# Patient Record
Sex: Female | Born: 1980 | Hispanic: Yes | Marital: Married | Smoking: Never smoker
Health system: Southern US, Community
[De-identification: ages and names within clinical notes are randomized; demographics above are authoritative.]

## PROBLEM LIST (undated history)

## (undated) ENCOUNTER — Inpatient Hospital Stay (HOSPITAL_COMMUNITY): Payer: Self-pay

## (undated) DIAGNOSIS — R519 Headache, unspecified: Secondary | ICD-10-CM

## (undated) HISTORY — PX: NERVE AND TENDON REPAIR: SHX5693

## (undated) HISTORY — DX: Headache, unspecified: R51.9

---

## 2002-11-16 HISTORY — PX: OTHER SURGICAL HISTORY: SHX169

## 2016-03-21 ENCOUNTER — Encounter (HOSPITAL_COMMUNITY): Payer: Self-pay | Admitting: *Deleted

## 2016-03-21 ENCOUNTER — Inpatient Hospital Stay (HOSPITAL_COMMUNITY): Payer: Medicaid Other

## 2016-03-21 ENCOUNTER — Inpatient Hospital Stay (HOSPITAL_COMMUNITY)
Admission: AD | Admit: 2016-03-21 | Discharge: 2016-03-21 | Disposition: A | Discharge status: Home / Self Care | Payer: Medicaid Other | Source: Ambulatory Visit | Attending: Obstetrics and Gynecology | Admitting: Obstetrics and Gynecology

## 2016-03-21 DIAGNOSIS — O209 Hemorrhage in early pregnancy, unspecified: Secondary | ICD-10-CM | POA: Diagnosis present

## 2016-03-21 DIAGNOSIS — R55 Syncope and collapse: Secondary | ICD-10-CM | POA: Insufficient documentation

## 2016-03-21 DIAGNOSIS — O039 Complete or unspecified spontaneous abortion without complication: Secondary | ICD-10-CM | POA: Insufficient documentation

## 2016-03-21 LAB — COMPREHENSIVE METABOLIC PANEL
ALT: 29 U/L (ref 14–54)
AST: 22 U/L (ref 15–41)
Albumin: 4.1 g/dL (ref 3.5–5.0)
Alkaline Phosphatase: 74 U/L (ref 38–126)
Anion gap: 13 (ref 5–15)
BUN: 8 mg/dL (ref 6–20)
CHLORIDE: 104 mmol/L (ref 101–111)
CO2: 20 mmol/L — AB (ref 22–32)
Calcium: 9.3 mg/dL (ref 8.9–10.3)
Creatinine, Ser: 0.65 mg/dL (ref 0.44–1.00)
GFR calc Af Amer: >60 mL/min (ref >60)
Glucose, Bld: 128 mg/dL — ABNORMAL HIGH (ref 65–99)
POTASSIUM: 3.6 mmol/L (ref 3.5–5.1)
SODIUM: 137 mmol/L (ref 135–145)
Total Bilirubin: 0.4 mg/dL (ref 0.3–1.2)
Total Protein: 7.4 g/dL (ref 6.5–8.1)

## 2016-03-21 LAB — CBC
HEMATOCRIT: 38.3 % (ref 36.0–46.0)
HEMOGLOBIN: 13.3 g/dL (ref 12.0–15.0)
MCH: 30.2 pg (ref 26.0–34.0)
MCHC: 34.7 g/dL (ref 30.0–36.0)
MCV: 86.8 fL (ref 78.0–100.0)
PLATELETS: 368 10*3/uL (ref 150–400)
RBC: 4.41 MIL/uL (ref 3.87–5.11)
RDW: 12.9 % (ref 11.5–15.5)
WBC: 15.3 10*3/uL — ABNORMAL HIGH (ref 4.0–10.5)

## 2016-03-21 LAB — TYPE AND SCREEN
ABO/RH(D): A POS
Antibody Screen: NEGATIVE

## 2016-03-21 LAB — ABO/RH: ABO/RH(D): A POS

## 2016-03-21 IMAGING — US US OB TRANSVAGINAL
1 series · 15 of 28 positions shown · non-contrast
Comparison: None.

CLINICAL DATA: Vaginal bleeding

EXAM:
OBSTETRIC <14 WK US AND TRANSVAGINAL OB US
TECHNIQUE: Both transabdominal and transvaginal ultrasound examinations were
performed for complete evaluation of the gestation as well as the
maternal uterus, adnexal regions, and pelvic cul-de-sac.
Transvaginal technique was performed to assess early pregnancy.

[Series 1: us ob transvaginal · 46 acquisitions, 15 frames shown]
[im 1/46]
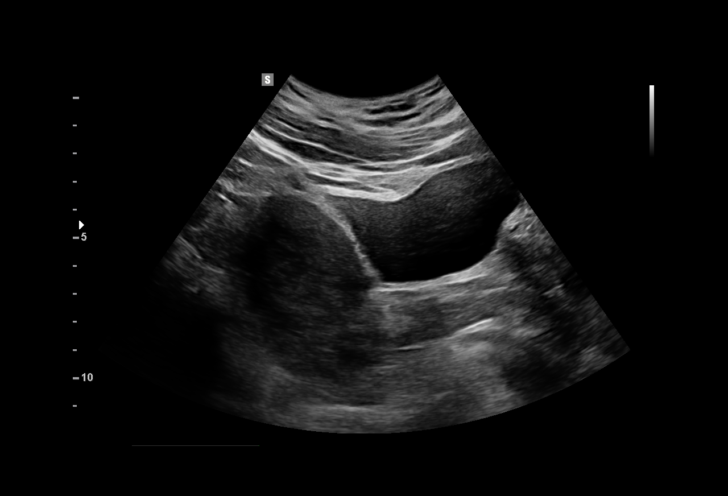
[im 4/46]
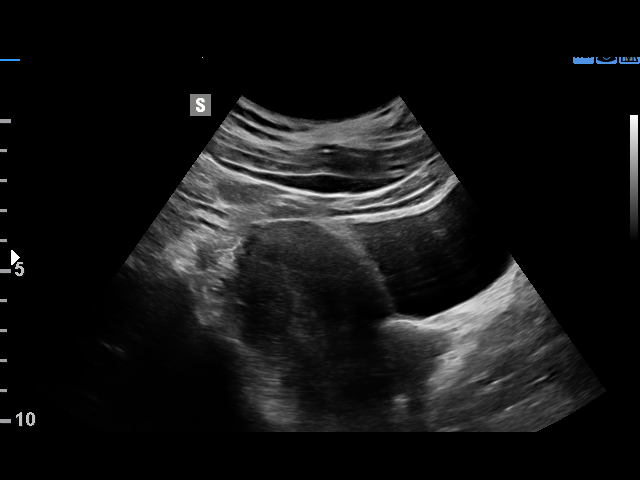
[im 7/46]
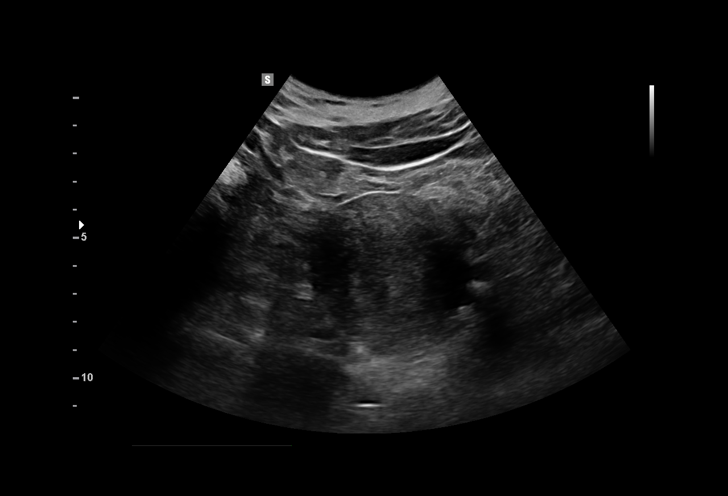
[im 11/46]
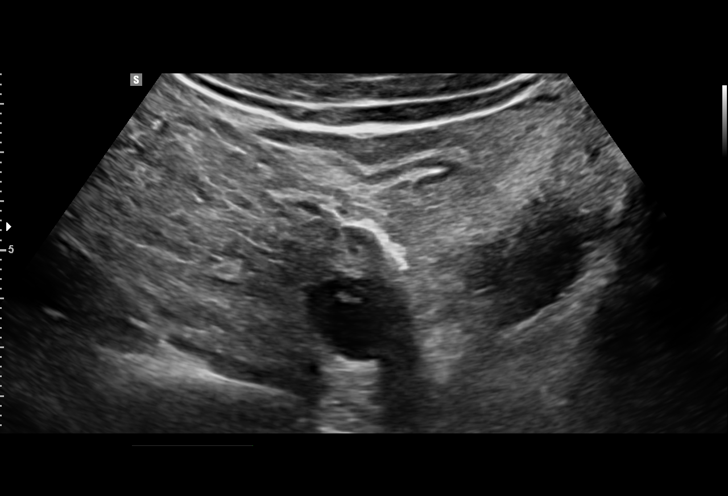
[im 14/46]
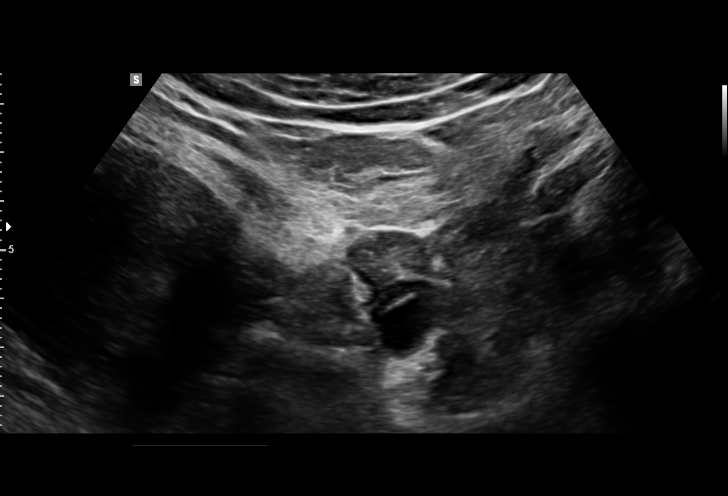
[im 17/46]
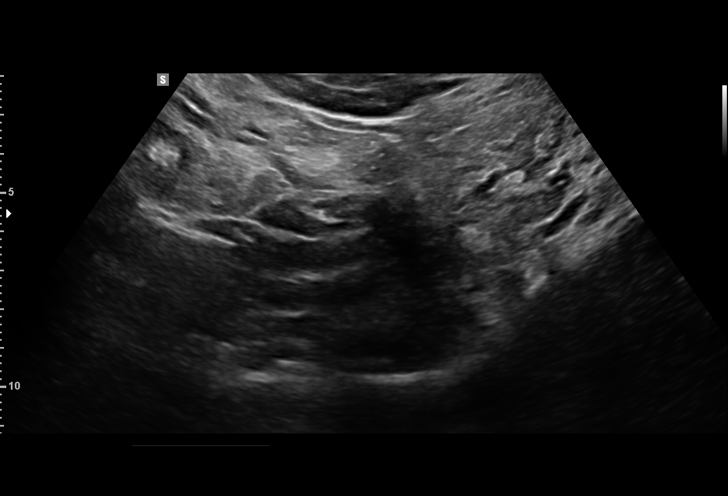
[im 21/46]
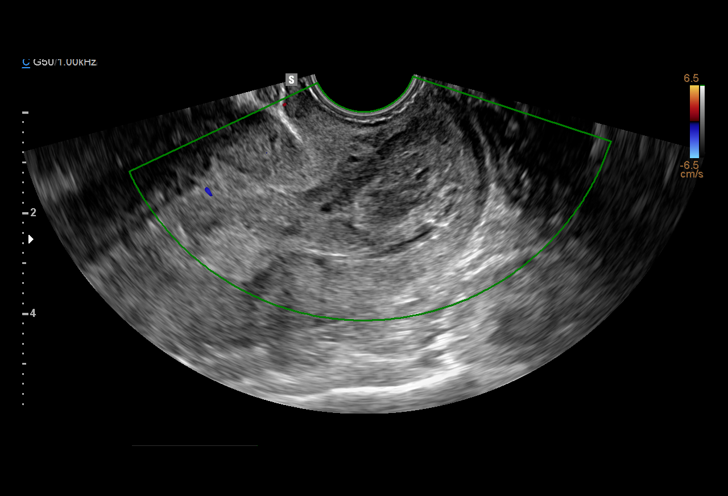
[im 24/46]
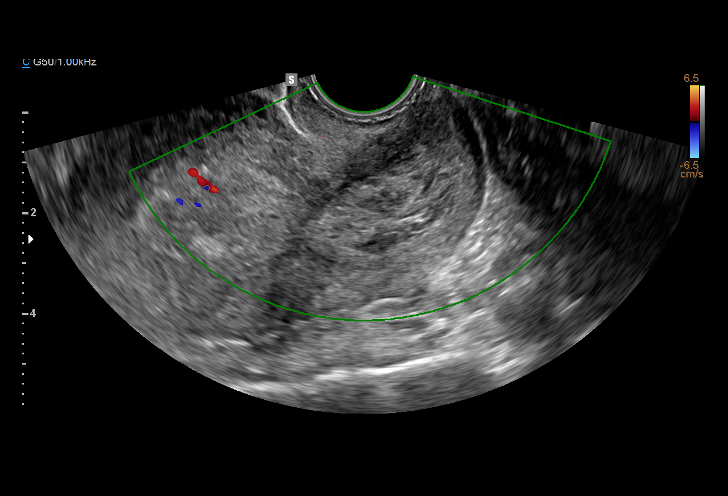
[im 26/46]
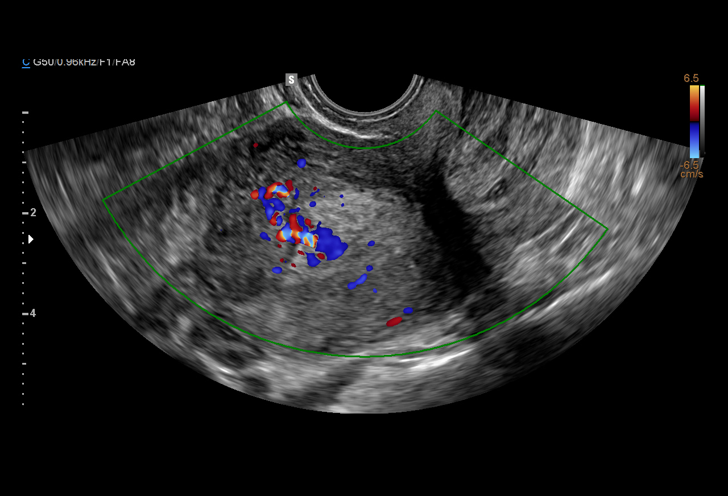
[im 29/46]
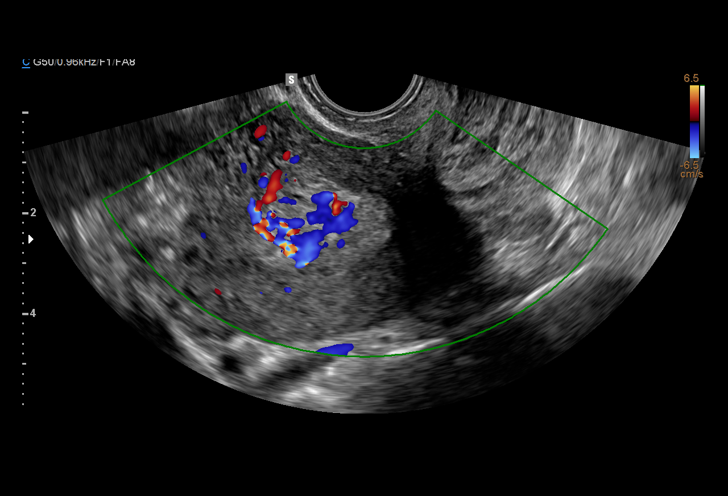
[im 32/46]
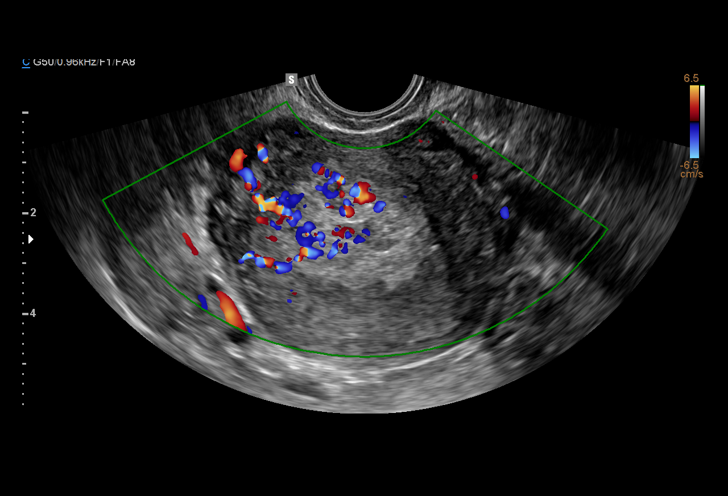
[im 36/46]
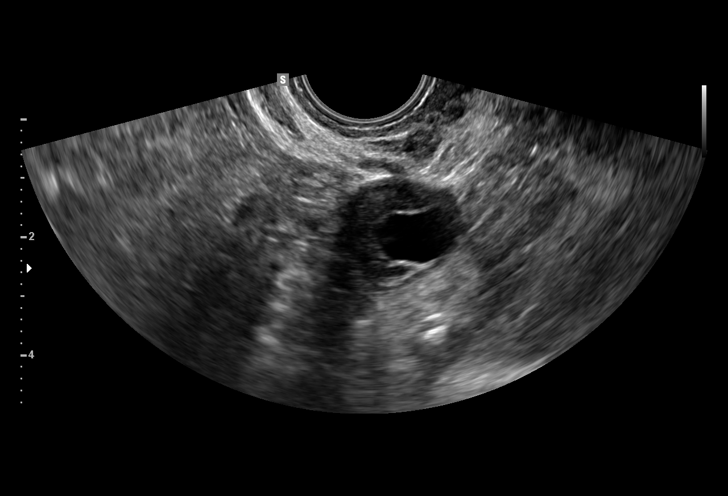
[im 39/46]
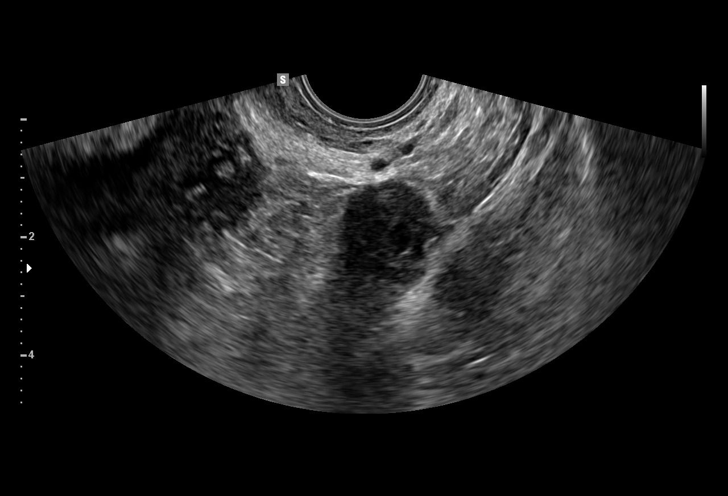
[im 42/46]
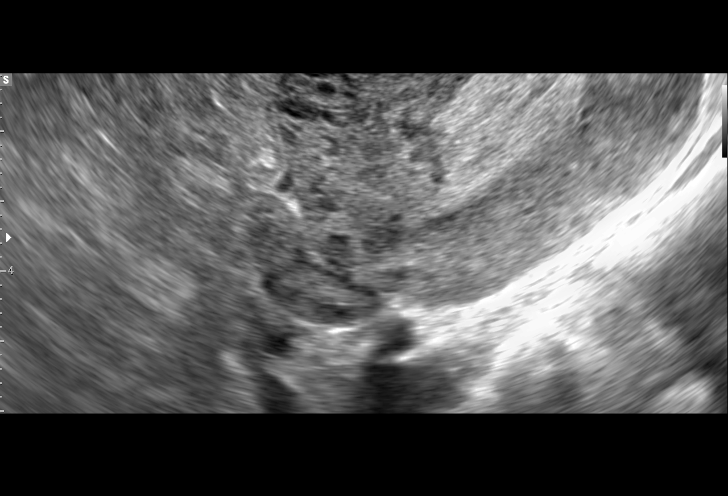
[im 46/46]
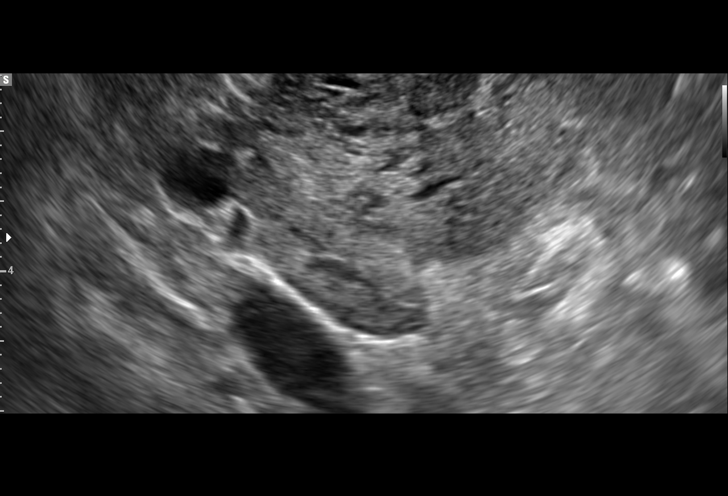

[15 of 28 positions shown; findings below may reference images not displayed]

FINDINGS: Intrauterine gestational sac: None

Yolk sac:  None

Embryo:  None

Cardiac Activity: None

Heart Rate:   bpm

MSD:   mm    w     d

CRL:    mm    w    d                  US EDC:

Subchorionic hemorrhage:  None visualized.

Maternal uterus/adnexae: Normal ovaries. Mild vascular blush in the
endometrium on the right, without discrete soft tissue or fluid. No
free pelvic fluid.
IMPRESSION: Mild vascularity of the endometrium without visible retained
products of conception.

## 2016-03-21 MED ORDER — SODIUM CHLORIDE 0.9 % IV BOLUS (SEPSIS)
1000.0000 mL | Freq: Once | INTRAVENOUS | Status: AC
  Administered 2016-03-21: 1000 mL via INTRAVENOUS

## 2016-03-21 NOTE — Discharge Instructions (Signed)
Aborto espontáneo  °(Miscarriage) °El aborto espontáneo es la pérdida de un bebé que no ha nacido (feto) antes de la semana 20 del embarazo. La mayor parte de estos abortos ocurre en los primeros 3 meses. En algunos casos ocurre antes de que la mujer sepa que está embarazada. También se denomina "aborto espontáneo" o "pérdida prematura del embarazo". El aborto espontáneo puede ser una experiencia que afecte emocionalmente a la persona. Converse con su médico si tiene dudas, cómo es el proceso de duelo, y sobre planes futuros de embarazo.  °CAUSAS  °· Algunos problemas cromosómicos pueden hacer imposible que el bebé se desarrolle normalmente. Los problemas con los genes o cromosomas del bebé son generalmente el resultado de errores que se producen, por casualidad, cuando el embrión se divide y crece. Estos problemas no se heredan de los padres. °· Infección en el cuello del útero.   °· Problemas hormonales.   °· Problemas en el cuello del útero, como tener un útero incompetente. Esto ocurre cuando los tejidos no son lo suficientemente fuertes como para contener el embarazo.   °· Problemas del útero, como un útero con forma anormal, los fibromas o anormalidades congénitas.   °· Ciertas enfermedades crónicas.   °· No fume, no beba alcohol, ni consuma drogas.   °· Traumatismos   °A veces, la causa es desconocida.  °SÍNTOMAS  °· Sangrado o manchado vaginal, con o sin cólicos o dolor. °· Dolor o cólicos en el abdomen o en la cintura. °· Eliminación de líquido, tejidos o coágulos grandes por la vagina. °DIAGNÓSTICO  °El médico le hará un examen físico. También le indicará una ecografía para confirmar el aborto. Es posible que se realicen análisis de sangre.  °TRATAMIENTO  °· En algunos casos el tratamiento no es necesario, si se eliminan naturalmente todos los tejidos embrionarios que se encontraban en el útero. Si el feto o la placenta quedan dentro del útero (aborto incompleto), pueden infectarse, los tejidos que quedan  pueden infectarse y deben retirarse. Generalmente se realiza un procedimiento de dilatación y curetaje (D y C). Durante el procedimiento de dilatación y curetaje, el cuello del útero se abre (dilata) y se retira cualquier resto de tejido fetal o placentario del útero. °· Si hay una infección, le recetarán antibióticos. Podrán recetarle otros medicamentos para reducir el tamaño del útero (contraerlo) si hay una mucho sangrado. °· Si su sangre es Rh negativa y su bebé es Rh positivo, usted necesitará la inyección de inmunoglobulina Rh. Esta inyección protegerá a los futuros bebés de tener problemas de compatibilidad Rh en futuros embarazos. °INSTRUCCIONES PARA EL CUIDADO EN EL HOGAR  °· El médico le indicará reposo en cama o le permitirá realizar actividades livianas. Vuelva a la actividad lentamente o según las indicaciones de su médico. °· Pídale a alguien que la ayude con las responsabilidades familiares y del hogar durante este tiempo.   °· Lleve un registro de la cantidad y la saturación de las toallas higiénicas que utiliza cada día. Anote esta información   °· No use tampones. No No se haga duchas vaginales ni tenga relaciones sexuales hasta que el médico la autorice.   °· Sólo tome medicamentos de venta libre o recetados para calmar el dolor o el malestar, según las indicaciones de su médico.   °· No tome aspirina. La aspirina puede ocasionar hemorragias.   °· Concurra puntualmente a las citas de control con el médico.   °· Si usted o su pareja tienen dificultades con el duelo, hable con su médico para buscar la ayuda psicológica que los ayude a enfrentar la pérdida   del embarazo. Permtase el tiempo suficiente de duelo antes de quedar embarazada nuevamente.  SOLICITE ATENCIN MDICA DE INMEDIATO SI:   Siente calambres intensos o dolor en la espalda o en el abdomen.  Tiene fiebre.  Elimina grandes cogulos de [place] (del tamao de una nuez o ms) o tejidos por la vagina. Guarde lo que ha eliminado para  que su mdico lo examine.   La hemorragia aumenta.   Brett Fairybserva una secrecin vaginal espesa y con mal olor.  Se siente mareada, dbil, o se desmaya.   Siente escalofros.  ASEGRESE DE QUE:   Comprende estas instrucciones.  Controlar su enfermedad.  Solicitar ayuda de inmediato si no mejora o si empeora.   Esta informacin no tiene Theme park managercomo fin reemplazar el consejo del mdico. Asegrese de hacerle al mdico cualquier pregunta que tenga.   Document Released: 08/12/2005 Document Revised: 02/27/2013 Elsevier Interactive Patient Education Yahoo[employer] Inc2016 Elsevier Inc.  (Vasovagal Syncope, Adult) El sncope, comnmente conocido como [place] Parkdesmayo, es una prdida transitoria de la conciencia. Se produce cuando se reduce el flujo sanguneo al cerebro. El sncope vasovagal (tambin llamado sncope neurocardiognico) es un desmayo en el que el flujo de sangre al cerebro se reduce a causa de una cada repentina en la frecuencia cardaca y la presin arterial. El sncope vasovagal se produce cuando el cerebro y el sistema cardiovascular (vasos sanguneos) no se comunican ni responden el uno al otro de [place]era adecuada. Esta es la causa ms comn de Jeffdesmayos. A menudo se produce como respuesta al miedo o algn otro tipo de estrs emocional o fsico. El cuerpo tiene una reaccin en la que el corazn comienza a latir muy lentamente o los vasos sanguneos se expanden, para reducir la presin arterial. Este tipo de desmayo se considera generalmente inofensivo. Sin embargo, pueden ocurrir lesiones si la persona tiene una cada repentina durante el Farrelldesmayo.  CAUSAS  El sncope vasovagal se produce cuando la presin arterial y la frecuencia cardaca de una persona disminuyen de repente, por lo general en respuesta a un factor desencadenante. Muchas cosas y situaciones pueden desencadenar un episodio. Entre ellas se incluyen:   Dolor.   Temor.   Ver sangre o procedimientos mdicos, como la sangre que se extrae de una  vena.   Las Brink's Companyactividades comunes, tales como toser, Financial controllerestirarse, o ir al bao.   Estrs emocional.   Permanecer mucho tiempo de pie, especialmente en un ambiente clido.   La falta de sueo o de descanso.   Prolongada falta de alimentos.   Prolongada falta de lquidos.   Enfermedad reciente.  El uso de ciertas drogas que afectan la presin arterial, como la cocana, el alcohol, la marihuana, los inhalantes y los opiceos.  SNTOMAS  Antes del episodio de desmayo, es posible que:   Se sienta mareado o dbil.   Se vuelve plido.  Sienta que se va a desmayar.   Siente como si la habitacin diera vueltas.   Tiene una visin de tnel, slo ve lo que hay directamente frente a usted.   Siente malestar estomacal (nuseas).   Ve manchas o pierde poco a poco la visin.   Escucha un zumbido en los odos.   Tiene dolor de Turkmenistancabeza.  Se siente afiebrado y sudoroso.   Tiene sensacin de hinchazn u hormigueo. Durante el desmayo, por lo general, estar inconsciente durante no ms de un par de minutos antes de despertar y volver a la normalidad. Si usted se levanta demasiado rpido antes de que su cuerpo pueda recuperarse, se va  a Systems analyst. Durante el desmayo pueden producirse movimientos espasmdicos o bruscos.  DIAGNSTICO  El mdico le preguntar [employer] sntomas y le har una historia clnica y un examen fsico. Se pueden hacer varios estudios para Sales promotion account executive otras causas de los [place]. Estos pueden incluir anlisis de [place] y pruebas para revisar el corazn, como un Midwife, y posiblemente, un estudio de Actor. Cuando otras causas han sido descartadas, se puede Education officer, environmental un examen para comprobar la respuesta del organismo a los cambios de posicin (prueba de la mesa basculante).  TRATAMIENTO  La mayora de los casos de sncope vasovagal no requieren TEFL teacher. Su mdico le puede Financial planner de Automotive engineer los  desencadenantes de [place] y puede proporcionarle estrategias caseras para prevenir desmayos. Si tiene que estar expuesto a un posible desencadenante, puede beber lquidos adicionales para ayudar a reducir sus probabilidades de sufrir un episodio de sncope vasovagal. Si hay signos de advertencia de un episodio que se acerca, usted puede responder posicionndose favorablemente (acostarse).  Si los [employer], se le pueden dar medicamentos para evitarlos. Algunos medicamentos pueden ayudar a hacerlo ms resistente a episodios repetidos de sncope vasovagal. Se pueden recomendar ejercicios especiales o medias de compresin. En raros casos, se considera la colocacin quirrgica de un marcapasos.  INSTRUCCIONES PARA EL CUIDADO DOMICILIARIO   Aprenda a identificar las seales del sncope vasovagal.   Sintese o acustese a la primera seal de un desmayo. Si se sienta, ponga su cabeza [employer]. Si usted se [place], coloque las piernas [place] para aumentar el flujo de sangre al cerebro.   Evite los baos calientes y los saunas.  Evite estar mucho tiempo de pie.  Debe ingerir gran cantidad de lquido para mantener la orina de tono claro o color amarillo plido. Evite la cafena.  Aumente la sal en su dieta siguiendo las indicaciones de su mdico.   Si tiene que estar parado durante mucho tiempo, realice movimientos tales como:   Psychologist, counselling las piernas.   Flexionar y Furniture conservator/restorer los msculos de las piernas.   Ponerse en cuclillas.   Mover las piernas.   Doblarse.   Tome slo medicamentos de venta libre o recetados, segn las indicaciones del mdico. No deje de tomar ningn medicamento sin consultar con su mdico primero. SOLICITE ATENCIN MDICA SI:   Sus desmayos continan o suceden con mayor frecuencia a pesar del tratamiento.   Pierde el conocimiento durante ms de un par de minutos.  Se ha desmayado durante o despus de hacer ejercicio o si se sorprende.    Usted tiene nuevos sntomas que se producen con los [employer]ogg, tales como:   Falta de [place].  Dolor en el pecho.   Latidos cardacos irregulares.   Usted tiene episodios de espasmos o movimientos bruscos que duran ms de unos pocos segundos.  Usted tiene episodios de espasmos o movimientos bruscos sin desmayos. BUSQUE ATENCIN MDICA DE INMEDIATO SI:   Usted tiene lesiones o hemorragia despus de un desmayo.   Usted tiene episodios de espasmos o movimientos bruscos que duran ms de 5 minutos.   Usted tiene ms de un episodio de movimientos espasmdicos antes de Copy la consciencia despus de un desmayo.   Esta informacin no tiene Theme park manager el consejo del mdico. Asegrese de hacerle al mdico cualquier pregunta que tenga.   Document Released: 02/27/2013 Document Revised: 03/19/2015 Elsevier Interactive Patient Education Yahoo! Inc.

## 2016-03-21 NOTE — MAU Note (Signed)
Was seen by Dr. Jackelyn KnifeMeisinger on Thursday, had an US no baby seen, so had to use Cytotec vaginally last night, took Percocet this morning, patient has had a lot of bleeding started around 3:00 with passing clots and after 7 only bleeding without clots, passed out twice one onto the bed and one fell in bathroom floor, having severe cramping, have not eaten since last night, changing a pad every 30 minutes.Jacqueline Gray. LMP 12/31/15

## 2016-03-21 NOTE — MAU Provider Note (Signed)
Chief Complaint: Vaginal Bleeding and Near Syncope   First Provider Initiated Contact with Patient 03/21/16 1919     SUBJECTIVE HPI: Jacqueline Gray is a 35 y.o. G1P0000 diagnosed with missed AB at [facility] on 03/19/2016 who presents to Maternity Admissions reporting heavy vaginal bleeding, passing clots and 2 syncopal episodes. Ultrasound 03/04/2016 in the office showed crown-rump length of 5 weeks 5 days with no fetal heart rate. Follow-up ultrasound 03/19/2016 showed gestational sac with no fetal pole.   Took Cytotec last night at 11 PM. Started bleeding heavily at 3 AM. Syncopal episodes occurred when patient got up out of bed because she felt like she needed to go to the bathroom. She passed clots and then passed out. With The first episode she thinks she was unconscious for a few minutes, but is not sure how long. The second episode occurred when her husband was coming home from work. He couldn't find her and realized that she was in the bathroom with the door locked. He states he had to kick the door in to get her. No history of syncopal episodes. Denies any injury during syncopal episodes.  Syncope  Duration: Less than 12 hours Associated symptoms: Positive for dizziness. Negative for loss of bladder control, tongue biting, shaking.  Abdominal pain Location: Suprapubic Quality: Cramping Severity: Moderate Duration: Less than 24 hours Context: With bleeding and passing clots Timing: Intermittent Modifying factors: Improved significantly with Percocet Associated signs and symptoms: Positive for vaginal bleeding, passing clots and possible tissue, syncope.  History reviewed. No pertinent past medical history. OB History  Gravida Para Term Preterm AB SAB TAB Ectopic Multiple Living  1    0 0        # Outcome Date GA Lbr Len/2nd Weight Sex Delivery Anes PTL Lv  1 Current              Past Surgical History  Procedure Laterality Date  . Nerve and tendon repair Left     arm    Social History   Social History  . Marital Status: Married    Spouse Name: N/A  . Number of Children: N/A  . Years of Education: N/A   Occupational History  . Not on file.   Social History Main Topics  . Smoking status: Never Smoker   . Smokeless tobacco: Not on file  . Alcohol Use: No  . Drug Use: No  . Sexual Activity: Yes   Other Topics Concern  . Not on file   Social History Narrative  . No narrative on file   No current facility-administered medications on file prior to encounter.   No current outpatient prescriptions on file prior to encounter.   No Known Allergies  I have reviewed the past Medical Hx, Surgical Hx, Social Hx, Allergies and Medications.   Review of Systems  Constitutional: Negative for fever and chills.  Eyes: Negative for visual disturbance.  Respiratory: Negative for shortness of breath.   Cardiovascular: Negative for chest pain.  Gastrointestinal: Positive for abdominal pain. Negative for nausea, vomiting, diarrhea and constipation.  Genitourinary: Positive for vaginal bleeding and pelvic pain. Negative for dysuria.  Neurological: Positive for dizziness and syncope. Negative for seizures, facial asymmetry, speech difficulty and headaches.    OBJECTIVE Patient Vitals for the past 24 hrs:  BP Temp Temp src Pulse Resp SpO2 Height Weight  03/21/16 1950 115/63 mmHg - - 96 - - - -  03/21/16 1526 116/71 mmHg 97.8 F (36.6 C) Oral - 18 100 %   (1.651 m) 160 lb 6.4 oz (72.757 kg)   Orthostatic vital signs Lying: BP 124/72  pulse 66 Sitting: BP 118/75 pulse 85  Standing: BP 107/77  pulse 1:15    Constitutional: Well-developed, well-nourished female in no acute distress. Mild pallor. Head: No swelling, tenderness or bruising Cardiovascular: normal rate Respiratory: normal rate and effort.  GI: Abd soft, non-tender. Neurologic: Alert and oriented x 4.  GU: Small amount of bright red blood on pad. Speculum exam deferred.  LAB  RESULTS Results for orders placed or performed during the hospital encounter of 03/21/16 (from the past 24 hour(s))  Comprehensive metabolic panel     Status: Abnormal   Collection Time: 03/21/16  3:44 PM  Result Value Ref Range   Sodium 137 135 - 145 mmol/L   Potassium 3.6 3.5 - 5.1 mmol/L   Chloride 104 101 - 111 mmol/L   CO2 20 (L) 22 - 32 mmol/L   Glucose, Bld 128 (H) 65 - 99 mg/dL   BUN 8 6 - 20 mg/dL   Creatinine, Ser 1.61 0.44 - 1.00 mg/dL   Calcium 9.3 8.9 - 09.6 mg/dL   Total Protein 7.4 6.5 - 8.1 g/dL   Albumin 4.1 3.5 - 5.0 g/dL   AST 22 15 - 41 U/L   ALT 29 14 - 54 U/L   Alkaline Phosphatase 74 38 - 126 U/L   Total Bilirubin 0.4 0.3 - 1.2 mg/dL   GFR calc non Af Amer >60 >60 mL/min   GFR calc Af Amer >60 >60 mL/min   Anion gap 13 5 - 15  ABO/Rh     Status: None   Collection Time: 03/21/16  3:49 PM  Result Value Ref Range   ABO/RH(D) A POS   CBC     Status: Abnormal   Collection Time: 03/21/16  3:50 PM  Result Value Ref Range   WBC 15.3 (H) 4.0 - 10.5 K/uL   RBC 4.41 3.87 - 5.11 MIL/uL   Hemoglobin 13.3 12.0 - 15.0 g/dL   HCT 04.5 40.9 - 81.1 %   MCV 86.8 78.0 - 100.0 fL   MCH 30.2 26.0 - 34.0 pg   MCHC 34.7 30.0 - 36.0 g/dL   RDW 91.4 78.2 - 95.6 %   Platelets 368 150 - 400 K/uL  Type and screen     Status: None   Collection Time: 03/21/16  3:50 PM  Result Value Ref Range   ABO/RH(D) A POS    Antibody Screen NEG    Sample Expiration 03/24/2016     IMAGING US Ob Comp Less 14 Wks  03/21/2016  CLINICAL DATA:  Vaginal bleeding EXAM: OBSTETRIC <14 WK [place] AND TRANSVAGINAL OB US TECHNIQUE: Both transabdominal and transvaginal ultrasound examinations were performed for complete evaluation of the gestation as well as the maternal uterus, adnexal regions, and pelvic cul-de-sac. Transvaginal technique was performed to assess early pregnancy. COMPARISON:  None. FINDINGS: Intrauterine gestational sac: None Yolk sac:  None Embryo:  None Cardiac Activity: None Heart Rate:    bpm MSD:   mm    w     d CRL:    mm    w    d                  [place] EDC: Subchorionic hemorrhage:  None visualized. Maternal uterus/adnexae: Normal ovaries. Mild vascular blush in the endometrium on the right, without discrete soft tissue or fluid. No free pelvic fluid. IMPRESSION: Mild vascularity of the endometrium  without visible retained products of conception. Electronically Signed   By: Ellery Plunkaniel R Mitchell M.D.   On: 03/21/2016 18:46   [place] Ob Transvaginal  03/21/2016  CLINICAL DATA:  Vaginal bleeding EXAM: OBSTETRIC <14 WK US AND TRANSVAGINAL OB US TECHNIQUE: Both transabdominal and transvaginal ultrasound examinations were performed for complete evaluation of the gestation as well as the maternal uterus, adnexal regions, and pelvic cul-de-sac. Transvaginal technique was performed to assess early pregnancy. COMPARISON:  None. FINDINGS: Intrauterine gestational sac: None Yolk sac:  None Embryo:  None Cardiac Activity: None Heart Rate:   bpm MSD:   mm    w     d CRL:    mm    w    d                  US EDC: Subchorionic hemorrhage:  None visualized. Maternal uterus/adnexae: Normal ovaries. Mild vascular blush in the endometrium on the right, without discrete soft tissue or fluid. No free pelvic fluid. IMPRESSION: Mild vascularity of the endometrium without visible retained products of conception. Electronically Signed   By: Ellery Plunkaniel R Mitchell M.D.   On: 03/21/2016 18:46    MAU COURSE CBC, CMP, type and screen, saline bolus, with a static vital signs, bedside ultrasound.  Patient feeling much better. Able to ambulate without dizziness. Minimal bleeding while in maternity admissions.  Discussed history, physical exam, lab and ultrasound reports with Dr. Mindi SlickerBanga. Agrees with plan of care. Patient appropriate for discharge with follow-up within the week.  MDM - 35 year old female with heavy bleeding and vasovagal syncopal episodes during passage of large clots and tissue during miscarriage. Miscarriage now  appears to be complete. Hemodynamically stable at time of discharge. No anemia.   ASSESSMENT 1. Complete miscarriage   2. Vasovagal syncope     PLAN Discharge home in stable conditionPer consult with Dr. Mindi SlickerBanga Bleeding and SAB precautions  Continue Motrin and Percocet as needed for pain.  Follow-up Information    Follow up with Bovard-Stuckert, Jody, MD. Schedule an appointment as soon as possible for a visit in 1 week.   Specialty:  Obstetrics and Gynecology   Contact information:   [address] [place] [id number] [phone]       [facility].   Why:  As needed if symptoms worsen   Contact information:   [address] [id number] mc [place] [id number] [phone]       Medication List    STOP taking these medications        misoprostol 200 MCG tablet  Commonly known as:  CYTOTEC      TAKE these medications        oxyCODONE-acetaminophen 5-325 MG tablet  Commonly known as:  PERCOCET/ROXICET  Take 1-2 tablets by mouth every 4 (four) hours as needed for severe pain.         DaleVirginia Reta Norgren, [place] 03/21/2016  7:19 PM

## 2016-03-22 NOTE — Progress Notes (Signed)
Assisted with interpretation of patient information.  Spanish Interpreter

## 2016-03-22 NOTE — Progress Notes (Signed)
Assisted Midwife with interpretation of discharge instructions.  Spanish Interpreter

## 2016-03-22 NOTE — Progress Notes (Signed)
Assisted RN with interpretation of patient assessment.   °Spanish Interpreter °

## 2016-10-15 LAB — OB RESULTS CONSOLE GC/CHLAMYDIA
Chlamydia: NEGATIVE
Gonorrhea: NEGATIVE

## 2016-10-15 LAB — OB RESULTS CONSOLE RPR: RPR: NONREACTIVE

## 2016-10-15 LAB — OB RESULTS CONSOLE ABO/RH: RH TYPE: POSITIVE

## 2016-10-15 LAB — OB RESULTS CONSOLE HEPATITIS B SURFACE ANTIGEN: HEP B S AG: NEGATIVE

## 2016-10-15 LAB — OB RESULTS CONSOLE RUBELLA ANTIBODY, IGM: Rubella: IMMUNE

## 2016-10-15 LAB — OB RESULTS CONSOLE HIV ANTIBODY (ROUTINE TESTING): HIV: NONREACTIVE

## 2016-10-15 LAB — OB RESULTS CONSOLE ANTIBODY SCREEN: Antibody Screen: NEGATIVE

## 2016-11-16 NOTE — L&D Delivery Note (Signed)
Delivery Note Pt pushed very well for delivery.  At 1:45 PM a viable and healthy female was delivered via Vaginal, Spontaneous Delivery (Presentation: OA; ROT  ).  APGAR: 9, 9; weight  P.   Placenta status: delivered, intact .  Cord: 3V with the following complications: none.    Anesthesia:  epidural Episiotomy: None Lacerations: Labial L Suture Repair: hemostatic - no repair Est. Blood Loss (mL): 200  Mom to postpartum.  Baby to Couplet care / Skin to Skin.  Jaylon Grode Bovard-Stuckert 05/13/2017, 1:59 PM  Br/A+/Tdap in PNC/Contra?Rachelle Hora/[place]

## 2017-01-24 ENCOUNTER — Encounter (HOSPITAL_COMMUNITY): Payer: Self-pay

## 2017-03-08 DIAGNOSIS — Z23 Encounter for immunization: Secondary | ICD-10-CM | POA: Diagnosis not present

## 2017-03-08 DIAGNOSIS — Z3A29 29 weeks gestation of pregnancy: Secondary | ICD-10-CM | POA: Diagnosis not present

## 2017-04-19 DIAGNOSIS — Z3685 Encounter for antenatal screening for Streptococcus B: Secondary | ICD-10-CM | POA: Diagnosis not present

## 2017-04-20 LAB — OB RESULTS CONSOLE GBS: STREP GROUP B AG: POSITIVE

## 2017-05-12 ENCOUNTER — Encounter (HOSPITAL_COMMUNITY): Payer: Self-pay | Admitting: *Deleted

## 2017-05-12 ENCOUNTER — Telehealth (HOSPITAL_COMMUNITY): Payer: Self-pay | Admitting: *Deleted

## 2017-05-12 NOTE — Telephone Encounter (Signed)
Preadmission screen [phone] interpreter number

## 2017-05-13 ENCOUNTER — Inpatient Hospital Stay (HOSPITAL_COMMUNITY): Payer: Medicaid Other | Admitting: Anesthesiology

## 2017-05-13 ENCOUNTER — Encounter (HOSPITAL_COMMUNITY): Payer: Self-pay

## 2017-05-13 ENCOUNTER — Inpatient Hospital Stay (HOSPITAL_COMMUNITY)
Admission: AD | Admit: 2017-05-13 | Discharge: 2017-05-15 | DRG: 775 | Disposition: A | Discharge status: Home / Self Care | Payer: Medicaid Other | Source: Ambulatory Visit | Attending: Obstetrics and Gynecology | Admitting: Obstetrics and Gynecology

## 2017-05-13 DIAGNOSIS — Z3A39 39 weeks gestation of pregnancy: Secondary | ICD-10-CM | POA: Diagnosis not present

## 2017-05-13 DIAGNOSIS — O99824 Streptococcus B carrier state complicating childbirth: Secondary | ICD-10-CM | POA: Diagnosis present

## 2017-05-13 DIAGNOSIS — Z3493 Encounter for supervision of normal pregnancy, unspecified, third trimester: Secondary | ICD-10-CM | POA: Diagnosis present

## 2017-05-13 LAB — TYPE AND SCREEN
ABO/RH(D): A POS
Antibody Screen: NEGATIVE

## 2017-05-13 LAB — CBC
HCT: 41.3 % (ref 36.0–46.0)
Hemoglobin: 14.3 g/dL (ref 12.0–15.0)
MCH: 30.4 pg (ref 26.0–34.0)
MCHC: 34.6 g/dL (ref 30.0–36.0)
MCV: 87.7 fL (ref 78.0–100.0)
PLATELETS: 276 10*3/uL (ref 150–400)
RBC: 4.71 MIL/uL (ref 3.87–5.11)
RDW: 13.2 % (ref 11.5–15.5)
WBC: 12.3 10*3/uL — ABNORMAL HIGH (ref 4.0–10.5)

## 2017-05-13 LAB — RPR: RPR Ser Ql: NONREACTIVE

## 2017-05-13 LAB — POCT FERN TEST: POCT Fern Test: POSITIVE

## 2017-05-13 MED ORDER — PHENYLEPHRINE 40 MCG/ML (10ML) SYRINGE FOR IV PUSH (FOR BLOOD PRESSURE SUPPORT)
80.0000 ug | PREFILLED_SYRINGE | INTRAVENOUS | Status: DC | PRN
  Filled 2017-05-13: qty 5

## 2017-05-13 MED ORDER — PENICILLIN G POT IN DEXTROSE 60000 UNIT/ML IV SOLN
3.0000 10*6.[IU] | INTRAVENOUS | Status: DC
  Administered 2017-05-13 (×2): 3 10*6.[IU] via INTRAVENOUS
  Filled 2017-05-13 (×8): qty 50

## 2017-05-13 MED ORDER — FENTANYL 2.5 MCG/ML BUPIVACAINE 1/10 % EPIDURAL INFUSION (WH - ANES)
14.0000 mL/h | INTRAMUSCULAR | Status: DC | PRN
  Administered 2017-05-13 (×2): 14 mL/h via EPIDURAL
  Filled 2017-05-13 (×2): qty 100

## 2017-05-13 MED ORDER — OXYCODONE HCL 5 MG PO TABS: 5.0000 mg | ORAL_TABLET | ORAL | Status: DC | PRN

## 2017-05-13 MED ORDER — LIDOCAINE HCL (PF) 1 % IJ SOLN
INTRAMUSCULAR | Status: DC | PRN
  Administered 2017-05-13: 4 mL via EPIDURAL
  Administered 2017-05-13: 7 mL via EPIDURAL

## 2017-05-13 MED ORDER — EPHEDRINE 5 MG/ML INJ
10.0000 mg | INTRAVENOUS | Status: DC | PRN
  Filled 2017-05-13: qty 2

## 2017-05-13 MED ORDER — LACTATED RINGERS IV SOLN: 500.0000 mL | INTRAVENOUS | Status: DC | PRN

## 2017-05-13 MED ORDER — ONDANSETRON HCL 4 MG/2ML IJ SOLN: 4.0000 mg | INTRAMUSCULAR | Status: DC | PRN

## 2017-05-13 MED ORDER — ACETAMINOPHEN 325 MG PO TABS: 650.0000 mg | ORAL_TABLET | ORAL | Status: DC | PRN

## 2017-05-13 MED ORDER — WITCH HAZEL-GLYCERIN EX PADS: 1.0000 "application " | MEDICATED_PAD | CUTANEOUS | Status: DC | PRN

## 2017-05-13 MED ORDER — LACTATED RINGERS IV SOLN
INTRAVENOUS | Status: DC
  Administered 2017-05-13: 05:00:00 via INTRAVENOUS

## 2017-05-13 MED ORDER — OXYCODONE HCL 5 MG PO TABS: 10.0000 mg | ORAL_TABLET | ORAL | Status: DC | PRN

## 2017-05-13 MED ORDER — LACTATED RINGERS IV SOLN: INTRAVENOUS | Status: DC

## 2017-05-13 MED ORDER — OXYTOCIN 40 UNITS IN LACTATED RINGERS INFUSION - SIMPLE MED
2.5000 [IU]/h | INTRAVENOUS | Status: DC
  Administered 2017-05-13: 2.5 [IU]/h via INTRAVENOUS
  Filled 2017-05-13: qty 1000

## 2017-05-13 MED ORDER — ONDANSETRON HCL 4 MG/2ML IJ SOLN: 4.0000 mg | Freq: Four times a day (QID) | INTRAMUSCULAR | Status: DC | PRN

## 2017-05-13 MED ORDER — PENICILLIN G POTASSIUM 5000000 UNITS IJ SOLR
5.0000 10*6.[IU] | Freq: Once | INTRAVENOUS | Status: AC
  Administered 2017-05-13: 5 10*6.[IU] via INTRAVENOUS
  Filled 2017-05-13: qty 5

## 2017-05-13 MED ORDER — COCONUT OIL OIL: 1.0000 "application " | TOPICAL_OIL | Status: DC | PRN

## 2017-05-13 MED ORDER — OXYTOCIN BOLUS FROM INFUSION
500.0000 mL | Freq: Once | INTRAVENOUS | Status: AC
  Administered 2017-05-13: 500 mL via INTRAVENOUS

## 2017-05-13 MED ORDER — DIBUCAINE 1 % RE OINT
1.0000 | TOPICAL_OINTMENT | RECTAL | Status: DC | PRN
Start: 2017-05-13 — End: 2017-05-15

## 2017-05-13 MED ORDER — ONDANSETRON HCL 4 MG PO TABS
4.0000 mg | ORAL_TABLET | ORAL | Status: DC | PRN
Start: 2017-05-13 — End: 2017-05-15

## 2017-05-13 MED ORDER — OXYCODONE-ACETAMINOPHEN 5-325 MG PO TABS: 1.0000 | ORAL_TABLET | ORAL | Status: DC | PRN

## 2017-05-13 MED ORDER — FLEET ENEMA 7-19 GM/118ML RE ENEM: 1.0000 | ENEMA | RECTAL | Status: DC | PRN

## 2017-05-13 MED ORDER — SOD CITRATE-CITRIC ACID 500-334 MG/5ML PO SOLN: 30.0000 mL | ORAL | Status: DC | PRN

## 2017-05-13 MED ORDER — DIPHENHYDRAMINE HCL 25 MG PO CAPS: 25.0000 mg | ORAL_CAPSULE | Freq: Four times a day (QID) | ORAL | Status: DC | PRN

## 2017-05-13 MED ORDER — ZOLPIDEM TARTRATE 5 MG PO TABS: 5.0000 mg | ORAL_TABLET | Freq: Every evening | ORAL | Status: DC | PRN

## 2017-05-13 MED ORDER — LACTATED RINGERS IV SOLN: 500.0000 mL | Freq: Once | INTRAVENOUS | Status: DC

## 2017-05-13 MED ORDER — OXYCODONE-ACETAMINOPHEN 5-325 MG PO TABS: 2.0000 | ORAL_TABLET | ORAL | Status: DC | PRN

## 2017-05-13 MED ORDER — SIMETHICONE 80 MG PO CHEW: 80.0000 mg | CHEWABLE_TABLET | ORAL | Status: DC | PRN

## 2017-05-13 MED ORDER — IBUPROFEN 600 MG PO TABS
600.0000 mg | ORAL_TABLET | Freq: Four times a day (QID) | ORAL | Status: DC
  Administered 2017-05-13 – 2017-05-15 (×6): 600 mg via ORAL
  Filled 2017-05-13 (×9): qty 1

## 2017-05-13 MED ORDER — DIPHENHYDRAMINE HCL 50 MG/ML IJ SOLN: 12.5000 mg | INTRAMUSCULAR | Status: DC | PRN

## 2017-05-13 MED ORDER — LIDOCAINE HCL (PF) 1 % IJ SOLN
30.0000 mL | INTRAMUSCULAR | Status: DC | PRN
  Filled 2017-05-13: qty 30

## 2017-05-13 MED ORDER — PRENATAL MULTIVITAMIN CH
1.0000 | ORAL_TABLET | Freq: Every day | ORAL | Status: DC
  Administered 2017-05-14 – 2017-05-15 (×2): 1 via ORAL
  Filled 2017-05-13 (×2): qty 1

## 2017-05-13 MED ORDER — SENNOSIDES-DOCUSATE SODIUM 8.6-50 MG PO TABS
2.0000 | ORAL_TABLET | ORAL | Status: DC
  Administered 2017-05-13: 2 via ORAL
  Filled 2017-05-13 (×2): qty 2

## 2017-05-13 MED ORDER — PHENYLEPHRINE 40 MCG/ML (10ML) SYRINGE FOR IV PUSH (FOR BLOOD PRESSURE SUPPORT)
PREFILLED_SYRINGE | INTRAVENOUS | Status: AC
  Filled 2017-05-13: qty 10

## 2017-05-13 MED ORDER — BENZOCAINE-MENTHOL 20-0.5 % EX AERO
1.0000 "application " | INHALATION_SPRAY | CUTANEOUS | Status: DC | PRN
  Administered 2017-05-13: 1 via TOPICAL
  Filled 2017-05-13: qty 56

## 2017-05-13 MED ORDER — TETANUS-DIPHTH-ACELL PERTUSSIS 5-2.5-18.5 LF-MCG/0.5 IM SUSP: 0.5000 mL | Freq: Once | INTRAMUSCULAR | Status: DC

## 2017-05-13 NOTE — MAU Note (Signed)
Pt here with c/o rupture of membranes at 0300; contractions about 0130. Denies any bleeding. Reports good fetal movement.

## 2017-05-13 NOTE — Plan of Care (Signed)
Problem: Education: Goal: Knowledge of condition will improve Admission education, safety and unit protocols reviewed with patient and significant other. Patient verbalizes understanding of information.

## 2017-05-13 NOTE — Anesthesia Pain Management Evaluation Note (Signed)
  CRNA Pain Management Visit Note  Patient: Jacqueline Gray, 36 y.o., female  "Hello I am a [facility]. We have an anesthesia team available at all times to provide care throughout the hospital, including epidural management and anesthesia for C-section. I don't know your plan for the delivery whether it a natural birth, water birth, IV sedation, nitrous supplementation, doula or epidural, but we want to meet your pain goals."   1.Was your pain managed to your expectations on prior hospitalizations?   Yes   2.What is your expectation for pain management during this hospitalization?     Epidural  3.How can we help you reach that goal? Support   Record the patient's initial score and the patient's pain goal.   Pain: 7/10  Pain Goal: 3/10 [facility] wants you to be able to say your pain was always managed very well.  Salome ArntSterling, Rekisha Welling Marie 05/13/2017

## 2017-05-13 NOTE — Lactation Note (Addendum)
This note was copied from a baby's chart. Lactation Consultation Note  Patient Name: Jacqueline Gray ZOXWR'UToday's Date: 05/13/2017 Reason for consult: Initial assessment   Initial consult with first time mom of < 1 hour old infant. Eda Royal, Hospital Interpreter in the room, mom declined need for interpreter assistance. Mom is able to understand and communicate in [place]. Mom reports she took a prenatal BF class.  Infant STS with mom and irritable. Mom has attempted to latch infant and infant not willing to latch. Mom is able to hand express colostrum. Assisted mom in latching infant to right breast in the cross cradle hold, infant latched off and on in between fussing. Enc mom to feed infant STS 8-12 x in 24 hours at first feeding cues. Enc mom to use pillow and head support with feeding. Mom is noted to give infant breathing space, enc mom to massage/compress breast with feeding vs pulling back on the breast.  Mom with soft compressible breasts and areola with everted nipples. Mom reports + breast changes with pregnancy.  Mom worked with Charity fundraiserN on hand expression and reports she is able to hand express colostrum.   BF basics, colostrum, milk coming to volume, hand expression, positioning, NB nutritional needs, feeding behaviors and cluster feeding reviewed.   BF Resources Handout and LC Brochure given, mom informed of IP/OP services, BF Support Groups and LC phone #. [facility] and is aware to call and make appt post d/c. Mom does not have a pump at home, plans to call insurance company to obtain one. Mom to call out for feeding assistance as needed. Mom and dad without questions/concerns at this time.       Maternal Data Formula Feeding for Exclusion: No Has patient been taught Hand Expression?: Yes Does the patient have breastfeeding experience prior to this delivery?: No  Feeding Feeding Type: Breast Fed Length of feed: 10 min (off and on)  LATCH  Score/Interventions Latch: Repeated attempts needed to sustain latch, nipple held in mouth throughout feeding, stimulation needed to elicit sucking reflex. Intervention(s): Adjust position;Assist with latch;Breast massage;Breast compression  Audible Swallowing: A few with stimulation Intervention(s): Skin to skin;Hand expression Intervention(s): Skin to skin;Hand expression;Alternate breast massage  Type of Nipple: Everted at rest and after stimulation  Comfort (Breast/Nipple): Soft / non-tender     Hold (Positioning): Assistance needed to correctly position infant at breast and maintain latch. Intervention(s): Breastfeeding basics reviewed;Support Pillows;Position options;Skin to skin  LATCH Score: 7  Lactation Tools Discussed/Used WIC Program: Yes   Consult Status Consult Status: Follow-up Date: 05/14/17 Follow-up type: In-patient    Silas FloodSharon S Zuleika Gallus 05/13/2017, 2:42 PM

## 2017-05-13 NOTE — Anesthesia Postprocedure Evaluation (Signed)
Anesthesia Post Note  Patient: Jacqueline Gray  Procedure(s) Performed: * No procedures listed *     Patient location during evaluation: Mother Baby Anesthesia Type: Epidural Level of consciousness: awake Pain management: pain level controlled Vital Signs Assessment: post-procedure vital signs reviewed and stable Respiratory status: spontaneous breathing Cardiovascular status: stable Postop Assessment: no headache, no signs of nausea or vomiting, no backache, epidural receding, patient able to bend at knees and adequate PO intake Anesthetic complications: no    Last Vitals:  Vitals:   05/13/17 1501 05/13/17 1520  BP: 131/80 118/65  Pulse: 85 72  Resp: 16 16  Temp:  36.9 C    Last Pain:  Vitals:   05/13/17 1520  TempSrc: Oral  PainSc: 0-No pain   Pain Goal:                 [name]

## 2017-05-13 NOTE — Anesthesia Procedure Notes (Signed)
Epidural Patient location during procedure: OB Start time: 05/13/2017 5:35 AM End time: 05/13/2017 5:38 AM  Staffing Anesthesiologist: Leilani AbleHATCHETT, [place] Performed: anesthesiologist   Preanesthetic Checklist Completed: patient identified, surgical consent, pre-op evaluation, timeout performed, IV checked, risks and benefits discussed and monitors and equipment checked  Epidural Patient position: sitting Prep: site prepped and draped and DuraPrep Patient monitoring: continuous pulse ox and blood pressure Approach: midline Location: L3-L4 Injection technique: LOR air  Needle:  Needle type: Tuohy  Needle gauge: 17 G Needle length: 9 cm and 9 Needle insertion depth: 6 cm Catheter type: closed end flexible Catheter size: 19 Gauge Catheter at skin depth: 11 cm Test dose: negative and Other  Assessment Sensory level: T9 Events: blood not aspirated, injection not painful, no injection resistance, negative IV test and no paresthesia  Additional Notes Reason for block:procedure for pain

## 2017-05-13 NOTE — Progress Notes (Signed)
Patient ID: Jacqueline Gray, female   DOB: 06/22/1981, 36 y.o.   MRN: [id number]   Comfortable with epidural  AFVSS gen NAD FHTs 130, mod var, + Accels, category 1 toco q 3 min  6cm per RN  Continue current mgmt

## 2017-05-13 NOTE — Anesthesia Preprocedure Evaluation (Signed)
Anesthesia Evaluation  Patient identified by MRN, date of birth, ID band Patient awake    Reviewed: Allergy & Precautions, H&P , NPO status , Patient's Chart, lab work & pertinent test results  Airway Mallampati: I  TM Distance: >3 FB Neck ROM: full    Dental no notable dental hx. (+) Teeth Intact   Pulmonary neg pulmonary ROS,    Pulmonary exam normal breath sounds clear to auscultation       Cardiovascular negative cardio ROS Normal cardiovascular exam Rhythm:regular Rate:Normal     Neuro/Psych negative neurological ROS  negative psych ROS   GI/Hepatic negative GI ROS, Neg liver ROS,   Endo/Other  negative endocrine ROS  Renal/GU negative Renal ROS  negative genitourinary   Musculoskeletal negative musculoskeletal ROS (+)   Abdominal (+) + obese,   Peds  Hematology negative hematology ROS (+)   Anesthesia Other Findings   Reproductive/Obstetrics (+) Pregnancy                             Anesthesia Physical Anesthesia Plan  ASA: II  Anesthesia Plan: Epidural   Post-op Pain Management:    Induction:   PONV Risk Score and Plan:   Airway Management Planned:   Additional Equipment:   Intra-op Plan:   Post-operative Plan:   Informed Consent: I have reviewed the patients History and Physical, chart, labs and discussed the procedure including the risks, benefits and alternatives for the proposed anesthesia with the patient or authorized representative who has indicated his/her understanding and acceptance.       Plan Discussed with:   Anesthesia Plan Comments:         Anesthesia Quick Evaluation  

## 2017-05-13 NOTE — Progress Notes (Signed)
5 big sponges 5 small sponges 10 instruments 2 injectables MO

## 2017-05-13 NOTE — H&P (Signed)
Jacqueline Gray is a 36 y.o. female, G2 P0010, EGA [redacted] weeks with EDC 7-5 presenting for leaking fluid and ctx.  Eval in MAu confirmed ROM, VE 3 cm.  Prenatal care essentially uncomplicated.  OB History    Gravida Para Term Preterm AB Living   2       1     SAB TAB Ectopic Multiple Live Births   1             History reviewed. No pertinent past medical history. Past Surgical History:  Procedure Laterality Date  . NERVE AND TENDON REPAIR Left    arm  . tummy tuck  2004   Family History: family history includes Cancer in her mother. Social History:  reports that she has never smoked. She has never used smokeless tobacco. She reports that she does not drink alcohol or use drugs.     Maternal Diabetes: No Genetic Screening: Normal Maternal Ultrasounds/Referrals: Normal Fetal Ultrasounds or other Referrals:  None Maternal Substance Abuse:  No Significant Maternal Medications:  None Significant Maternal Lab Results:  Lab values include: Group B Strep positive Other Comments:  None  Review of Systems  Respiratory: Negative.   Cardiovascular: Negative.    Maternal Medical History:  Reason for admission: Rupture of membranes and contractions.   Contractions: Frequency: irregular.   Perceived severity is moderate.    Fetal activity: Perceived fetal activity is normal.    Prenatal complications: no prenatal complications Prenatal Complications - Diabetes: none.    Dilation: 4.5 Effacement (%): 100 Station: -2 Exam by:: Lajuana Matteina Jacobs, RN Blood pressure 116/76, pulse 98, temperature 97.7 F (36.5 C), temperature source Oral, resp. rate 18, height 5\' 4"  (1.626 m), weight 82.6 kg (182 lb), last menstrual period 08/13/2016, SpO2 97 %, unknown if currently breastfeeding. Maternal Exam:  Uterine Assessment: Contraction strength is moderate.  Contraction frequency is irregular.   Abdomen: Patient reports no abdominal tenderness. Estimated fetal weight is 7 lbs.   Fetal  presentation: vertex  Introitus: Normal vulva. Normal vagina.  Ferning test: positive.  Amniotic fluid character: clear.  Pelvis: adequate for delivery.   Cervix: Cervix evaluated by digital exam.     Fetal Exam Fetal Monitor Review: Mode: ultrasound.   Baseline rate: 120.  Variability: moderate (6-25 bpm).   Pattern: accelerations present and late decelerations.    Fetal State Assessment: Category II - tracings are indeterminate.     Physical Exam  Vitals reviewed. Constitutional: She appears well-developed and well-nourished.  Cardiovascular: Normal rate and regular rhythm.   Respiratory: Effort normal. No respiratory distress.  GI: Soft.    Prenatal labs: ABO, Rh: --/--/A POS (06/28 16100420) Antibody: PENDING (06/28 0420) Rubella: Immune (11/30 0000) RPR: Nonreactive (11/30 0000)  HBsAg: Negative (11/30 0000)  HIV: Non-reactive (11/30 0000)  GBS: Positive (06/05 0000)   Assessment/Plan: IUP at 39 weeks with ROM in early labor.  Now comfortable with epidural, ctx are a bit irregular.  will monitor progress, augment with pitocin if needed, PCN for +GBS.     Leighton Roachodd D Benjamin Casanas 05/13/2017, 6:56 AM

## 2017-05-14 LAB — CBC
HEMATOCRIT: 37.7 % (ref 36.0–46.0)
HEMOGLOBIN: 12.9 g/dL (ref 12.0–15.0)
MCH: 30.9 pg (ref 26.0–34.0)
MCHC: 34.2 g/dL (ref 30.0–36.0)
MCV: 90.4 fL (ref 78.0–100.0)
Platelets: 237 10*3/uL (ref 150–400)
RBC: 4.17 MIL/uL (ref 3.87–5.11)
RDW: 13.8 % (ref 11.5–15.5)
WBC: 17.7 10*3/uL — ABNORMAL HIGH (ref 4.0–10.5)

## 2017-05-14 NOTE — Lactation Note (Signed)
This note was copied from a baby's chart. Lactation Consultation Note  Patient Name: Jacqueline Gray ZOXWR'UToday's Date: 05/14/2017 Reason for consult: Follow-up assessment  Follow up visit at 32 hours of age.  Mom reports just finishing a feeding.  Baby fussy, LC encouraged parents to burp baby and then she calmed. Mom reports some soreness with latching.  Nipples are intact and appear WNL, LC encouraged mom to hand express and apply EBM to nipples.  LC offered to assist mom with technique and she declines.  LC encouraged mom [facility].  Mom to call for assist as needed.      Maternal Data Has patient been taught Hand Expression?: Yes  Feeding Feeding Type: Breast Fed Length of feed: 15 min  LATCH Score/Interventions          Comfort (Breast/Nipple): Filling, red/small blisters or bruises, mild/mod discomfort     Intervention(s): Breastfeeding basics reviewed     Lactation Tools Discussed/Used     Consult Status Consult Status: Follow-up Date: 05/15/17 Follow-up type: In-patient    Shoptaw, Arvella MerlesJana Lynn 05/14/2017, 9:58 PM

## 2017-05-14 NOTE — Plan of Care (Signed)
Problem: Role Relationship: Goal: Ability to demonstrate positive interaction with newborn will improve Outcome: Progressing Appropriate bonding showed between mom and baby.  Problem: Bowel/Gastric: Goal: Gastrointestinal status will improve Outcome: Progressing Active bowel sounds  Problem: Urinary Elimination: Goal: Ability to reestablish a normal urinary elimination pattern will improve Outcome: Progressing Mom is up to the bathroom to void without assistance.

## 2017-05-14 NOTE — Progress Notes (Signed)
Post Partum Day 1 Subjective: no complaints, up ad lib, voiding, tolerating PO, + flatus and bonding well with baby - breastfeeding.   Objective: Blood pressure 108/61, pulse (!) 58, temperature 97.8 F (36.6 C), temperature source Oral, resp. rate 16, height 5\' 4"  (1.626 m), weight 182 lb (82.6 kg), last menstrual period 08/13/2016, SpO2 97 %, unknown if currently breastfeeding.  Physical Exam:  General: alert, cooperative and no distress Lochia: appropriate Uterine Fundus: firm Incision: n/a DVT Evaluation: No evidence of DVT seen on physical exam. Negative Homan's sign.   Recent Labs  05/13/17 0416 05/14/17 0512  HGB 14.3 12.9  HCT 41.3 37.7    Assessment/Plan: Plan for discharge tomorrow and Breastfeeding   LOS: 1 day   Ryu Cerreta W Neil Brickell 05/14/2017, 9:12 AM

## 2017-05-15 MED ORDER — IBUPROFEN 600 MG PO TABS: 600.0000 mg | ORAL_TABLET | Freq: Four times a day (QID) | ORAL | 1 refills | Status: DC | PRN

## 2017-05-15 NOTE — Progress Notes (Signed)
Patient ID: Jacqueline Gray, female   DOB: 07/25/1981, 36 y.o.   MRN: [id number] Pt doing well. Ambulating with no issues, voiding well, no CP, SOB or HAs. Bonding well with baby. Ready for discharge to home today VSS ABD - soft, ND EXT - no edema or homans  A/P: G2P2002 on PPD#2 s/p svd - stable         Discharge instructions reviewed         F/u in 6weeks

## 2017-05-15 NOTE — Discharge Instructions (Signed)
Nothing in vagina for 6 weeks.  No sex, tampons, and douching.  Other instructions as in [facility] Discharge Booklet. °

## 2017-05-15 NOTE — Lactation Note (Addendum)
This note was copied from a baby's chart. Lactation Consultation Note  Patient Name: Jacqueline Gray FAOZH'YToday's Date: 05/15/2017 Reason for consult: Follow-up assessment Mom reports she thinks baby is nursing well. Mom reports RN assisted with feeding earlier this am and she feels confident with latch. Declined latching baby at this visit. Some mild nipple tenderness reported, advised to apply EBM and could use coconut oil prn. Advised baby should be at breast 8-12 times in 24 hours and with feeding ques. Cluster feeding discussed. Engorgement care reviewed and Mom given Harmony Hand pump if needed for home. Mom will be using 27 flange. Questions answered about returning to work. Encouraged to come to support group. Advised of OP services if needed. Plans to see LC thru [facility]. Interpreter present for start of visit but Mom declined need for interpreter.   Maternal Data    Feeding Length of feed: 20 min  LATCH Score/Interventions Latch: Grasps breast easily, tongue down, lips flanged, rhythmical sucking. Intervention(s): Assist with latch  Audible Swallowing: A few with stimulation  Type of Nipple: Everted at rest and after stimulation  Comfort (Breast/Nipple): Filling, red/small blisters or bruises, mild/mod discomfort  Problem noted: Mild/Moderate discomfort  Hold (Positioning): Assistance needed to correctly position infant at breast and maintain latch.  LATCH Score: 7  Lactation Tools Discussed/Used Tools: Pump Breast pump type: Double-Electric Breast Pump   Consult Status Consult Status: Complete Date: 05/15/17 Follow-up type: In-patient    Alfred LevinsGranger, Tanique Matney Ann 05/15/2017, 11:26 AM

## 2017-05-15 NOTE — Discharge Summary (Signed)
OB Discharge Summary     Patient Name: Jacqueline Gray DOB: 12/25/1980 MRN: [id number]  Date of admission: 05/13/2017 Delivering MD: Sherian Rein   Date of discharge: 05/15/2017  Admitting diagnosis: 39 WEEKS CTX LEAKING FLUID  Intrauterine pregnancy: [redacted]w[redacted]d     Secondary diagnosis:  Principal Problem:   SVD (spontaneous vaginal delivery) Active Problems:   Indication for care in labor or delivery  Additional problems: none     Discharge diagnosis: Term Pregnancy Delivered                                                                                                Post partum procedures:none\  Augmentation: Pitocin  Complications: None  Hospital course:  Onset of Labor With Vaginal Delivery     36 y.o. yo G2P1011 at [redacted]w[redacted]d was admitted in Latent Labor on 05/13/2017. Patient had an uncomplicated labor course as follows:  Membrane Rupture Time/Date: 3:00 AM ,05/13/2017   Intrapartum Procedures: Episiotomy: None [1]                                         Lacerations:  Labial [10]  Patient had a delivery of a Viable infant. 05/13/2017  Information for the patient's newborn:  Jacqueline Gray, Girl Jacqueline Gray [[id number]]  Delivery Method: Vag-Spont    Pateint had an uncomplicated postpartum course.  She is ambulating, tolerating a regular diet, passing flatus, and urinating well. Patient is discharged home in stable condition on 05/15/17.   Physical exam  Vitals:   05/13/17 2010 05/14/17 0527 05/14/17 1809 05/15/17 0607  BP: (!) 119/56 108/61 112/66 106/63  Pulse: 78 (!) 58 70 65  Resp: 16 16 17 18   Temp: 98.7 F (37.1 C) 97.8 F (36.6 C) 97.7 F (36.5 C) 97.9 F (36.6 C)  TempSrc: Oral Oral Oral   SpO2:      Weight:      Height:       General: alert, cooperative and no distress Lochia: appropriate Uterine Fundus: firm Incision: N/A DVT Evaluation: No evidence of DVT seen on physical exam. Negative Homan's sign. Labs: Lab Results  Component Value  Date   WBC 17.7 (H) 05/14/2017   HGB 12.9 05/14/2017   HCT 37.7 05/14/2017   MCV 90.4 05/14/2017   PLT 237 05/14/2017   CMP Latest Ref Rng & Units 03/21/2016  Glucose 65 - 99 mg/dL 782(N)  BUN 6 - 20 mg/dL 8  Creatinine 5.62 - 1.30 mg/dL 8.65  Sodium 784 - 696 mmol/L 137  Potassium 3.5 - 5.1 mmol/L 3.6  Chloride 101 - 111 mmol/L 104  CO2 22 - 32 mmol/L 20(L)  Calcium 8.9 - 10.3 mg/dL 9.3  Total Protein 6.5 - 8.1 g/dL 7.4  Total Bilirubin 0.3 - 1.2 mg/dL 0.4  Alkaline Phos 38 - 126 U/L 74  AST 15 - 41 U/L 22  ALT 14 - 54 U/L 29    Discharge instruction: per After Visit Summary and "Baby and Me Booklet".  After visit meds:  Allergies as of 05/15/2017   No Known Allergies     Medication List    TAKE these medications   ibuprofen 600 MG tablet Commonly known as:  ADVIL,MOTRIN Take 1 tablet (600 mg total) by mouth every 6 (six) hours as needed.   prenatal multivitamin Tabs tablet Take 1 tablet by mouth daily at 12 noon.       Diet: routine diet  Activity: Advance as tolerated. Pelvic rest for 6 weeks.   Outpatient follow up:6 weeks Follow up Appt:No future appointments. Follow up Visit:No Follow-up on file.  Postpartum contraception: Undecided  Newborn Data: Live born female  Birth Weight: 7 lb 7 oz (3374 g) APGAR: 9, 9  Baby Feeding: Breast Disposition:home with mother   05/15/2017 Jacqueline Musterecilia W Evelin Cake, DO

## 2017-05-17 ENCOUNTER — Inpatient Hospital Stay (HOSPITAL_COMMUNITY): Admission: RE | Admit: 2017-05-17 | Payer: Medicaid Other | Source: Ambulatory Visit

## 2017-06-24 DIAGNOSIS — Z3009 Encounter for other general counseling and advice on contraception: Secondary | ICD-10-CM | POA: Diagnosis not present

## 2017-12-17 ENCOUNTER — Encounter: Payer: Self-pay | Admitting: Medical

## 2017-12-17 ENCOUNTER — Ambulatory Visit (INDEPENDENT_AMBULATORY_CARE_PROVIDER_SITE_OTHER): Payer: BLUE CROSS/BLUE SHIELD | Admitting: Medical

## 2017-12-17 VITALS — BP 112/70 | HR 69 | Temp 98.0°F | Resp 16 | Wt 159.2 lb

## 2017-12-17 DIAGNOSIS — H1032 Unspecified acute conjunctivitis, left eye: Secondary | ICD-10-CM | POA: Diagnosis not present

## 2017-12-17 DIAGNOSIS — Z23 Encounter for immunization: Secondary | ICD-10-CM | POA: Diagnosis not present

## 2017-12-17 MED ORDER — AZITHROMYCIN 1 % OP SOLN: OPHTHALMIC | 0 refills | Status: DC

## 2017-12-17 MED ORDER — TOBRAMYCIN 0.3 % OP SOLN
1.0000 [drp] | Freq: Four times a day (QID) | OPHTHALMIC | 0 refills | Status: DC
Start: 2017-12-17 — End: 2019-06-08

## 2017-12-17 MED FILL — TOBRAMYCIN 0.3 % SOLN: 0.3 | 13 days supply | Qty: 5 | Fill #0

## 2017-12-17 NOTE — Patient Instructions (Signed)
You appear to have conjunctivitis(possible bacterial). I am prescribing  tobrex antibiotic eye drops. If eye redness worsens or changes let us know. Expect some improvement by Monday. If not any then would refer to optometrist.   Also if any rash around eyes or/temporal area notify us as shingles can occur but doubt this presently.  Follow up in 7 days or as needed

## 2017-12-17 NOTE — Progress Notes (Signed)
Subjective:    Patient ID: Jacqueline Gray, female    DOB: 08/08/1981, 37 y.o.   MRN: [id number]  HPI  First time here.  No chronic medical problems.  Pt is mom. Pt works at [employer]. Eats healthy, walks a lot at work. No smoker, no alcohol. Married- 1 baby.   Pt had mild red left  eye for one week. Pt states not getting better. No itching. No eye pain. No dc. No skin rash. No blurred vision. Pt not using other than dry eyes saline type drops.     Review of Systems  Constitutional: Negative for chills, fatigue and fever.  HENT: Negative for congestion, ear pain, sinus pain and sore throat.   Eyes: Positive for redness. Negative for photophobia, pain, discharge, itching and visual disturbance.  Respiratory: Negative for chest tightness and shortness of breath.   Cardiovascular: Negative for chest pain and palpitations.  Gastrointestinal: Negative for abdominal pain, diarrhea and nausea.  Musculoskeletal: Negative for back pain.  Skin: Positive for rash.  Neurological: Negative for dizziness, speech difficulty, weakness, numbness and headaches.  Hematological: Negative for adenopathy. Does not bruise/bleed easily.  Psychiatric/Behavioral: Negative for behavioral problems, confusion, dysphoric mood and self-injury. The patient is not nervous/anxious.    Past Medical History:  Diagnosis Date  . SVD (spontaneous vaginal delivery) 05/13/2017     Social History   Socioeconomic History  . Marital status: Married    Spouse name: Not on file  . Number of children: Not on file  . Years of education: Not on file  . Highest education level: Not on file  Social Needs  . Financial resource strain: Not on file  . Food insecurity - worry: Not on file  . Food insecurity - inability: Not on file  . Transportation needs - medical: Not on file  . Transportation needs - non-medical: Not on file  Occupational History  . Not on file  Tobacco Use  . Smoking  status: Never Smoker  . Smokeless tobacco: Never Used  Substance and Sexual Activity  . Alcohol use: No  . Drug use: No  . Sexual activity: Yes  Other Topics Concern  . Not on file  Social History Narrative  . Not on file    Past Surgical History:  Procedure Laterality Date  . NERVE AND TENDON REPAIR Left    arm  . tummy tuck  2004    Family History  Problem Relation Age of Onset  . Cancer Mother     No Known Allergies  Current Outpatient Medications on File Prior to Visit  Medication Sig Dispense Refill  . ibuprofen (ADVIL,MOTRIN) 600 MG tablet Take 1 tablet (600 mg total) by mouth every 6 (six) hours as needed. 40 tablet 1  . Prenatal Vit-Fe Fumarate-FA (PRENATAL MULTIVITAMIN) TABS tablet Take 1 tablet by mouth daily at 12 noon.     No current facility-administered medications on file prior to visit.     BP 112/70   Pulse 69   Temp 98 F (36.7 C) (Oral)   Resp 16   Wt 159 lb 3.2 oz (72.2 kg)   SpO2 98%   BMI 27.33 kg/m       Objective:   Physical Exam  General  Mental Status - Alert. General Appearance - Well groomed. Not in acute distress.  Skin Rashes- No Rashes.  Particularly none in periorbital/left temporal region. HEENT Head- Normal. Ear Auditory Canal - Left- Normal. Right - Normal.Tympanic Membrane- Left- Normal. Right-  Normal. Eye Sclera/Conjunctiva- Left- mild conjunctivitis. No dc.  Right- Normal. Nose & Sinuses Nasal Mucosa- Left-  Boggy and Congested. Right-  Boggy and  Congested.Bilateral no  maxillary and no  frontal sinus pressure. Mouth & Throat Lips: Upper Lip- Normal: no dryness, cracking, pallor, cyanosis, or vesicular eruption. Lower Lip-Normal: no dryness, cracking, pallor, cyanosis or vesicular eruption. Buccal Mucosa- Bilateral- No Aphthous ulcers. Oropharynx- No Discharge or Erythema. Tonsils: Characteristics- Bilateral- No Erythema or Congestion. Size/Enlargement- Bilateral- No enlargement. Discharge-  bilateral-None.  Neck Neck- Supple. No Masses.   Chest and Lung Exam Auscultation: Breath Sounds:-Clear even and unlabored.  Cardiovascular Auscultation:Rythm- Regular, rate and rhythm. Murmurs & Other Heart Sounds:Ausculatation of the heart reveal- No Murmurs.  Lymphatic Head & Neck General Head & Neck Lymphatics: Bilateral: Description- No Localized lymphadenopathy.       Assessment & Plan:  You appear to have conjunctivitis(possible bacterial). I am prescribing  tobrex antibiotic eye drops. If eye redness worsens or changes let us know. Expect some improvement by Monday. If not any then would refer to optometrist.   Also if any rash around eyes or/temporal area notify us as shingles can occur but doubt this presently.  Follow up in 7 days or as needed  Discussed rx tobrex safety with pharmacist. Stated likely ok. Can be used in infants past 2 months.   Esperanza RichtersEdward Clarke Amburn, PA-C

## 2017-12-21 ENCOUNTER — Telehealth: Payer: Self-pay | Admitting: Medical

## 2017-12-21 NOTE — Telephone Encounter (Signed)
Copied from CRM [phone]#[id number]. Topic: Referral - Request >> Dec 21, 2017  9:01 AM Jacqueline Gray, Jacqueline Gray wrote: Reason for CRM: Patient called and said that she was diagnose with conjunctivitis(possible bacterial) and is not feeling better. She would like a referral to see a specialist just like Dr. Alvira MondaySaguier recommended for her. Please call patient back, thanks.

## 2017-12-22 ENCOUNTER — Telehealth: Payer: Self-pay | Admitting: Medical

## 2017-12-22 DIAGNOSIS — H1032 Unspecified acute conjunctivitis, left eye: Secondary | ICD-10-CM

## 2017-12-22 NOTE — Telephone Encounter (Signed)
Referral to optometry placed.  Can you help out and see if we can get her in later today or tomorrow.  Does she have an optometrist she is used in the past.  If so we could refer her to them.  Optometry would be fine at this point.  I do not think ophthalmologist referral necessary.

## 2017-12-22 NOTE — Telephone Encounter (Signed)
Please let patient know that I put in the referral.  Jacqueline Gray should be working on that.  The referral will be to optometrist.  I am trying to get her in with somebody today or tomorrow.

## 2017-12-23 NOTE — Telephone Encounter (Signed)
LVM and informed the below so that pt can be aware that she will be receiving call regarding about the referral.

## 2017-12-28 DIAGNOSIS — H10012 Acute follicular conjunctivitis, left eye: Secondary | ICD-10-CM | POA: Diagnosis not present

## 2018-01-04 ENCOUNTER — Ambulatory Visit (INDEPENDENT_AMBULATORY_CARE_PROVIDER_SITE_OTHER): Payer: BLUE CROSS/BLUE SHIELD | Admitting: Medical

## 2018-01-04 ENCOUNTER — Telehealth: Payer: Self-pay | Admitting: Medical

## 2018-01-04 ENCOUNTER — Encounter: Payer: Self-pay | Admitting: Medical

## 2018-01-04 VITALS — BP 102/62 | HR 56 | Temp 98.1°F | Resp 16 | Ht 65.0 in | Wt 157.4 lb

## 2018-01-04 DIAGNOSIS — H10012 Acute follicular conjunctivitis, left eye: Secondary | ICD-10-CM | POA: Diagnosis not present

## 2018-01-04 DIAGNOSIS — Z0000 Encounter for general adult medical examination without abnormal findings: Secondary | ICD-10-CM

## 2018-01-04 DIAGNOSIS — R319 Hematuria, unspecified: Secondary | ICD-10-CM

## 2018-01-04 LAB — CBC WITH DIFFERENTIAL/PLATELET
Basophils Absolute: 0 10*3/uL (ref 0.0–0.1)
Basophils Relative: 0.4 % (ref 0.0–3.0)
EOS ABS: 0.1 10*3/uL (ref 0.0–0.7)
EOS PCT: 0.7 % (ref 0.0–5.0)
HEMATOCRIT: 39.3 % (ref 36.0–46.0)
HEMOGLOBIN: 13.2 g/dL (ref 12.0–15.0)
LYMPHS PCT: 29.3 % (ref 12.0–46.0)
Lymphs Abs: 2.5 10*3/uL (ref 0.7–4.0)
MCHC: 33.5 g/dL (ref 30.0–36.0)
MCV: 89.7 fl (ref 78.0–100.0)
MONO ABS: 0.6 10*3/uL (ref 0.1–1.0)
Monocytes Relative: 7.1 % (ref 3.0–12.0)
Neutro Abs: 5.3 10*3/uL (ref 1.4–7.7)
Neutrophils Relative %: 62.5 % (ref 43.0–77.0)
Platelets: 380 10*3/uL (ref 150.0–400.0)
RBC: 4.38 Mil/uL (ref 3.87–5.11)
RDW: 13.4 % (ref 11.5–15.5)
WBC: 8.5 10*3/uL (ref 4.0–10.5)

## 2018-01-04 LAB — COMPREHENSIVE METABOLIC PANEL
ALK PHOS: 83 U/L (ref 39–117)
ALT: 13 U/L (ref 0–35)
AST: 12 U/L (ref 0–37)
Albumin: 3.9 g/dL (ref 3.5–5.2)
BILIRUBIN TOTAL: 0.4 mg/dL (ref 0.2–1.2)
BUN: 13 mg/dL (ref 6–23)
CALCIUM: 9 mg/dL (ref 8.4–10.5)
CO2: 28 mEq/L (ref 19–32)
CREATININE: 0.6 mg/dL (ref 0.40–1.20)
Chloride: 109 mEq/L (ref 96–112)
GFR: 118.94 mL/min (ref >60.00)
Glucose, Bld: 78 mg/dL (ref 70–99)
Potassium: 3.7 mEq/L (ref 3.5–5.1)
SODIUM: 141 meq/L (ref 135–145)
TOTAL PROTEIN: 6.9 g/dL (ref 6.0–8.3)

## 2018-01-04 LAB — LIPID PANEL
Cholesterol: 135 mg/dL (ref 0–200)
HDL: 48.5 mg/dL (ref >39.00)
LDL CALC: 74 mg/dL (ref 0–99)
NonHDL: 86.77
TRIGLYCERIDES: 64 mg/dL (ref 0.0–149.0)
Total CHOL/HDL Ratio: 3
VLDL: 12.8 mg/dL (ref 0.0–40.0)

## 2018-01-04 LAB — URINALYSIS, ROUTINE W REFLEX MICROSCOPIC
BILIRUBIN URINE: NEGATIVE
KETONES UR: NEGATIVE
LEUKOCYTES UA: NEGATIVE
NITRITE: NEGATIVE
SPECIFIC GRAVITY, URINE: 1.025 (ref 1.000–1.030)
Total Protein, Urine: NEGATIVE
URINE GLUCOSE: NEGATIVE
UROBILINOGEN UA: 0.2 (ref 0.0–1.0)
pH: 6 (ref 5.0–8.0)

## 2018-01-04 NOTE — Progress Notes (Signed)
Subjective:    Patient ID: Jacqueline Gray, female    DOB: 07/07/1981, 37 y.o.   MRN: [id number]  HPI  Pt in today. She has been fasting.  No acute complaints.  Up to date on pap. Last pap was in 2018 and normal.  Last tdap 2018.  Up to date on flu vaccine.  Pt does work at [employer]. Pt does not know how many steps she takes but walks all day. Healthy diet. Married- one child.    Review of Systems  Constitutional: Negative for chills, fatigue and fever.  HENT: Negative for congestion and drooling.   Respiratory: Negative for cough, chest tightness, shortness of breath and wheezing.   Cardiovascular: Negative for chest pain and palpitations.  Gastrointestinal: Negative for abdominal pain.  Endocrine: Negative for polydipsia, polyphagia and polyuria.  Genitourinary: Negative for dysuria and frequency.  Musculoskeletal: Negative for back pain and joint swelling.  Skin: Negative for rash.  Neurological: Negative for dizziness, light-headedness, numbness and headaches.  Hematological: Negative for adenopathy. Does not bruise/bleed easily.  Psychiatric/Behavioral: Negative for behavioral problems, confusion, self-injury, sleep disturbance and suicidal ideas. The patient is not nervous/anxious.    Past Medical History:  Diagnosis Date  . SVD (spontaneous vaginal delivery) 05/13/2017     Social History   Socioeconomic History  . Marital status: Married    Spouse name: Not on file  . Number of children: Not on file  . Years of education: Not on file  . Highest education level: Not on file  Social Needs  . Financial resource strain: Not on file  . Food insecurity - worry: Not on file  . Food insecurity - inability: Not on file  . Transportation needs - medical: Not on file  . Transportation needs - non-medical: Not on file  Occupational History  . Not on file  Tobacco Use  . Smoking status: Never Smoker  . Smokeless tobacco: Never Used    Substance and Sexual Activity  . Alcohol use: No  . Drug use: No  . Sexual activity: Yes  Other Topics Concern  . Not on file  Social History Narrative  . Not on file    Past Surgical History:  Procedure Laterality Date  . NERVE AND TENDON REPAIR Left    arm  . tummy tuck  2004    Family History  Problem Relation Age of Onset  . Cancer Mother     No Known Allergies  Current Outpatient Medications on File Prior to Visit  Medication Sig Dispense Refill  . ibuprofen (ADVIL,MOTRIN) 600 MG tablet Take 1 tablet (600 mg total) by mouth every 6 (six) hours as needed. 40 tablet 1  . Prenatal Vit-Fe Fumarate-FA (PRENATAL MULTIVITAMIN) TABS tablet Take 1 tablet by mouth daily at 12 noon.    . tobramycin (TOBREX) 0.3 % ophthalmic solution Place 1 drop into the left eye every 6 (six) hours. 5 mL 0   No current facility-administered medications on file prior to visit.     BP 102/62   Pulse (!) 56   Temp 98.1 F (36.7 C) (Oral)   Resp 16   Ht 5\' 5"  (1.651 m)   Wt 157 lb 6.4 oz (71.4 kg)   HC 62" (157.5 cm)   SpO2 99%   BMI 26.19 kg/m      Objective:   Physical Exam  General Mental Status- Alert. General Appearance- Not in acute distress.   HEENT exam negative.  Skin General: Color- Normal Color.  Moisture- Normal Moisture. No worrisome moles or lesions on inspection.  Neck Carotid Arteries- Normal color. Moisture- Normal Moisture. No carotid bruits. No JVD.  Chest and Lung Exam Auscultation: Breath Sounds:-Normal.  Cardiovascular Auscultation:Rythm- Regular. Murmurs & Other Heart Sounds:Auscultation of the heart reveals- No Murmurs.  Abdomen Inspection:-Inspeection Normal. Palpation/Percussion:Note:No mass. Palpation and Percussion of the abdomen reveal- Non Tender, Non Distended + BS, no rebound or guarding.   Neurologic Cranial Nerve exam:- CN III-XII intact(No nystagmus), symmetric smile. Strength:- 5/5 equal and symmetric strength both upper and lower  extremities.   Back- no cva tenderness    Assessment & Plan:  For you wellness exam today I have ordered cbc, cmp, lipid panel, and ua.  Vaccine up to date.  Recommend exercise and healthy diet.  We will let you know lab results as they come in.  Follow up date appointment will be determined after lab review.

## 2018-01-04 NOTE — Patient Instructions (Addendum)
For you wellness exam today I have ordered cbc, cmp, lipid panel, and ua.  Vaccine up to date.  Recommend exercise and healthy diet.  We will let you know lab results as they come in.  Follow up date appointment will be determined after lab review.    Preventive Care 18-39 Years, Female Preventive care refers to lifestyle choices and visits with your health care provider that can promote health and wellness. What does preventive care include?  A yearly physical exam. This is also called an annual well check.  Dental exams once or twice a year.  Routine eye exams. Ask your health care provider how often you should have your eyes checked.  Personal lifestyle choices, including: ? Daily care of your teeth and gums. ? Regular physical activity. ? Eating a healthy diet. ? Avoiding tobacco and drug use. ? Limiting alcohol use. ? Practicing safe sex. ? Taking vitamin and mineral supplements as recommended by your health care provider. What happens during an annual well check? The services and screenings done by your health care provider during your annual well check will depend on your age, overall health, lifestyle risk factors, and family history of disease. Counseling Your health care provider may ask you questions about your:  Alcohol use.  Tobacco use.  Drug use.  Emotional well-being.  Home and relationship well-being.  Sexual activity.  Eating habits.  Work and work Statistician.  Method of birth control.  Menstrual cycle.  Pregnancy history.  Screening You may have the following tests or measurements:  Height, weight, and BMI.  Diabetes screening. This is done by checking your blood sugar (glucose) after you have not eaten for a while (fasting).  Blood pressure.  Lipid and cholesterol levels. These may be checked every 5 years starting at age 73.  Skin check.  Hepatitis C blood test.  Hepatitis B blood test.  Sexually transmitted disease (STD)  testing.  BRCA-related cancer screening. This may be done if you have a family history of breast, ovarian, tubal, or peritoneal cancers.  Pelvic exam and Pap test. This may be done every 3 years starting at age 52. Starting at age 35, this may be done every 5 years if you have a Pap test in combination with an HPV test.  Discuss your test results, treatment options, and if necessary, the need for more tests with your health care provider. Vaccines Your health care provider may recommend certain vaccines, such as:  Influenza vaccine. This is recommended every year.  Tetanus, diphtheria, and acellular pertussis (Tdap, Td) vaccine. You may need a Td booster every 10 years.  Varicella vaccine. You may need this if you have not been vaccinated.  HPV vaccine. If you are 61 or younger, you may need three doses over 6 months.  Measles, mumps, and rubella (MMR) vaccine. You may need at least one dose of MMR. You may also need a second dose.  Pneumococcal 13-valent conjugate (PCV13) vaccine. You may need this if you have certain conditions and were not previously vaccinated.  Pneumococcal polysaccharide (PPSV23) vaccine. You may need one or two doses if you smoke cigarettes or if you have certain conditions.  Meningococcal vaccine. One dose is recommended if you are age 75-21 years and a first-year college student living in a residence hall, or if you have one of several medical conditions. You may also need additional booster doses.  Hepatitis A vaccine. You may need this if you have certain conditions or if you travel or work  in places where you may be exposed to hepatitis A.  Hepatitis B vaccine. You may need this if you have certain conditions or if you travel or work in places where you may be exposed to hepatitis B.  Haemophilus influenzae type b (Hib) vaccine. You may need this if you have certain risk factors.  Talk to your health care provider about which screenings and vaccines you  need and how often you need them. This information is not intended to replace advice given to you by your health care provider. Make sure you discuss any questions you have with your health care provider. Document Released: 12/29/2001 Document Revised: 07/22/2016 Document Reviewed: 09/03/2015 Elsevier Interactive Patient Education  [employer].

## 2018-01-04 NOTE — Telephone Encounter (Signed)
Future urine place today.

## 2018-07-20 DIAGNOSIS — H15002 Unspecified scleritis, left eye: Secondary | ICD-10-CM | POA: Diagnosis not present

## 2018-10-24 DIAGNOSIS — N912 Amenorrhea, unspecified: Secondary | ICD-10-CM | POA: Diagnosis not present

## 2018-10-24 DIAGNOSIS — Z3201 Encounter for pregnancy test, result positive: Secondary | ICD-10-CM | POA: Diagnosis not present

## 2018-10-24 DIAGNOSIS — Z23 Encounter for immunization: Secondary | ICD-10-CM | POA: Diagnosis not present

## 2018-11-07 DIAGNOSIS — Z3A09 9 weeks gestation of pregnancy: Secondary | ICD-10-CM | POA: Diagnosis not present

## 2018-11-07 DIAGNOSIS — O26891 Other specified pregnancy related conditions, first trimester: Secondary | ICD-10-CM | POA: Diagnosis not present

## 2018-11-10 DIAGNOSIS — Z3A10 10 weeks gestation of pregnancy: Secondary | ICD-10-CM | POA: Diagnosis not present

## 2018-11-10 DIAGNOSIS — Z3689 Encounter for other specified antenatal screening: Secondary | ICD-10-CM | POA: Diagnosis not present

## 2018-11-10 DIAGNOSIS — Z3682 Encounter for antenatal screening for nuchal translucency: Secondary | ICD-10-CM | POA: Diagnosis not present

## 2018-11-10 DIAGNOSIS — Z113 Encounter for screening for infections with a predominantly sexual mode of transmission: Secondary | ICD-10-CM | POA: Diagnosis not present

## 2018-11-10 DIAGNOSIS — O09521 Supervision of elderly multigravida, first trimester: Secondary | ICD-10-CM | POA: Diagnosis not present

## 2018-11-10 LAB — OB RESULTS CONSOLE RUBELLA ANTIBODY, IGM: Rubella: IMMUNE

## 2018-11-10 LAB — OB RESULTS CONSOLE HIV ANTIBODY (ROUTINE TESTING): HIV: NONREACTIVE

## 2018-11-10 LAB — OB RESULTS CONSOLE ABO/RH: RH Type: POSITIVE

## 2018-11-10 LAB — OB RESULTS CONSOLE RPR: RPR: NONREACTIVE

## 2018-11-10 LAB — OB RESULTS CONSOLE HEPATITIS B SURFACE ANTIGEN: Hepatitis B Surface Ag: NEGATIVE

## 2018-11-10 LAB — OB RESULTS CONSOLE GC/CHLAMYDIA
Chlamydia: NEGATIVE
Gonorrhea: NEGATIVE

## 2018-11-10 LAB — OB RESULTS CONSOLE ANTIBODY SCREEN: Antibody Screen: NEGATIVE

## 2018-11-16 NOTE — L&D Delivery Note (Signed)
Delivery Note Pt with pressure and urge to push, progressed to C/C?+2, pushed for 10 min for delivery,  At 5:43 PM a viable and healthy female was delivered via Vaginal, Spontaneous (Presentation: OA; ROT  ).  APGAR: 9, 9; weight  P.   Placenta status: delivered,intact.  Cord: 3V with the following complications: none.    Anesthesia:  epidural Episiotomy: None Lacerations: Labial abrasions Suture Repair: N/A Est. Blood Loss (mL): 75cc  Mom to postpartum.  Baby to Couplet care / Skin to Skin.  Jacqueline Gray 06/08/2019, 6:36 PM  A+/Br/RI/Tdap ?Jacqueline Gray?  Desires circ for female infant, d/w pt r/b/a will have in office

## 2018-12-01 DIAGNOSIS — Z3682 Encounter for antenatal screening for nuchal translucency: Secondary | ICD-10-CM | POA: Diagnosis not present

## 2018-12-01 DIAGNOSIS — O09511 Supervision of elderly primigravida, first trimester: Secondary | ICD-10-CM | POA: Diagnosis not present

## 2018-12-01 DIAGNOSIS — Z3A13 13 weeks gestation of pregnancy: Secondary | ICD-10-CM | POA: Diagnosis not present

## 2018-12-24 ENCOUNTER — Encounter (HOSPITAL_COMMUNITY): Payer: Self-pay | Admitting: *Deleted

## 2018-12-24 ENCOUNTER — Inpatient Hospital Stay (HOSPITAL_COMMUNITY)
Admission: AD | Admit: 2018-12-24 | Discharge: 2018-12-24 | Disposition: A | Discharge status: Home / Self Care | Payer: BLUE CROSS/BLUE SHIELD | Source: Ambulatory Visit | Attending: Obstetrics and Gynecology | Admitting: Obstetrics and Gynecology

## 2018-12-24 ENCOUNTER — Inpatient Hospital Stay (HOSPITAL_BASED_OUTPATIENT_CLINIC_OR_DEPARTMENT_OTHER): Payer: BLUE CROSS/BLUE SHIELD

## 2018-12-24 DIAGNOSIS — O4691 Antepartum hemorrhage, unspecified, first trimester: Secondary | ICD-10-CM

## 2018-12-24 DIAGNOSIS — O4690 Antepartum hemorrhage, unspecified, unspecified trimester: Secondary | ICD-10-CM

## 2018-12-24 DIAGNOSIS — Z3A16 16 weeks gestation of pregnancy: Secondary | ICD-10-CM | POA: Diagnosis not present

## 2018-12-24 DIAGNOSIS — O209 Hemorrhage in early pregnancy, unspecified: Secondary | ICD-10-CM | POA: Insufficient documentation

## 2018-12-24 DIAGNOSIS — O09522 Supervision of elderly multigravida, second trimester: Secondary | ICD-10-CM | POA: Insufficient documentation

## 2018-12-24 DIAGNOSIS — Z6790 Unspecified blood type, Rh positive: Secondary | ICD-10-CM

## 2018-12-24 DIAGNOSIS — O4692 Antepartum hemorrhage, unspecified, second trimester: Secondary | ICD-10-CM

## 2018-12-24 DIAGNOSIS — R1032 Left lower quadrant pain: Secondary | ICD-10-CM | POA: Diagnosis not present

## 2018-12-24 DIAGNOSIS — R109 Unspecified abdominal pain: Secondary | ICD-10-CM | POA: Diagnosis not present

## 2018-12-24 LAB — URINALYSIS, MICROSCOPIC (REFLEX)

## 2018-12-24 LAB — URINALYSIS, ROUTINE W REFLEX MICROSCOPIC
BILIRUBIN URINE: NEGATIVE
Glucose, UA: NEGATIVE mg/dL
KETONES UR: NEGATIVE mg/dL
LEUKOCYTES UA: NEGATIVE
NITRITE: NEGATIVE
PROTEIN: NEGATIVE mg/dL
Specific Gravity, Urine: >1.03 — ABNORMAL HIGH (ref 1.005–1.030)
pH: 5 (ref 5.0–8.0)

## 2018-12-24 LAB — WET PREP, GENITAL
Clue Cells Wet Prep HPF POC: NONE SEEN
Sperm: NONE SEEN
TRICH WET PREP: NONE SEEN
YEAST WET PREP: NONE SEEN

## 2018-12-24 IMAGING — US US MFM OB LIMITED
1 series · 15 of 28 positions shown · non-contrast
Comparison: none

[Series 1: us mfm ob limited · 43 acquisitions, 15 frames shown]
[im 1/43]
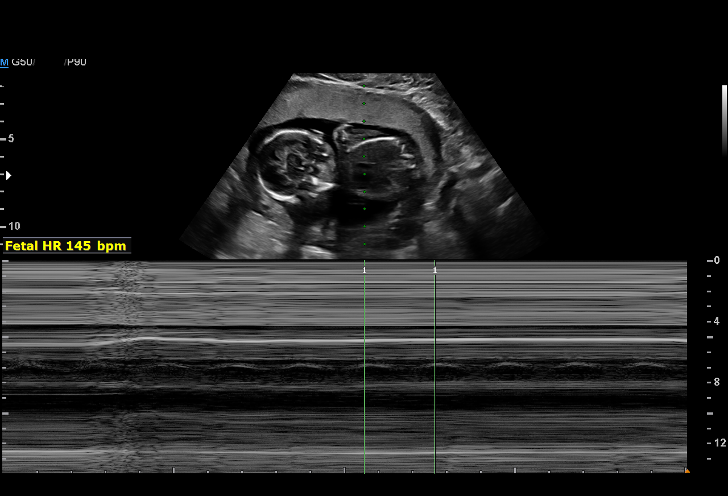
[im 4/43]
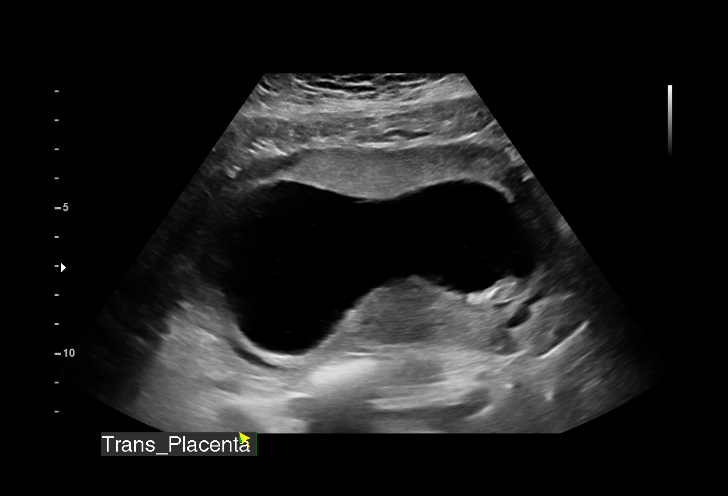
[im 7/43]
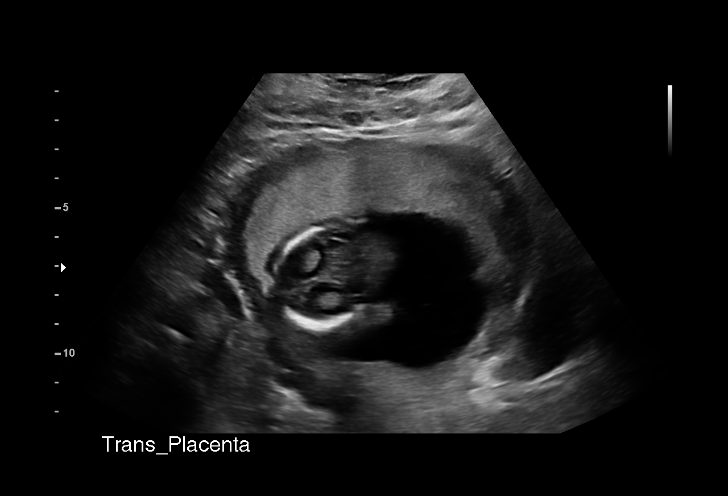
[im 10/43]
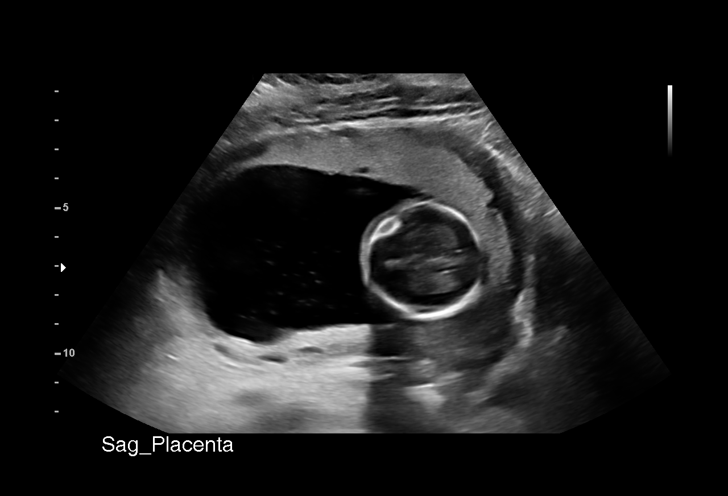
[im 13/43]
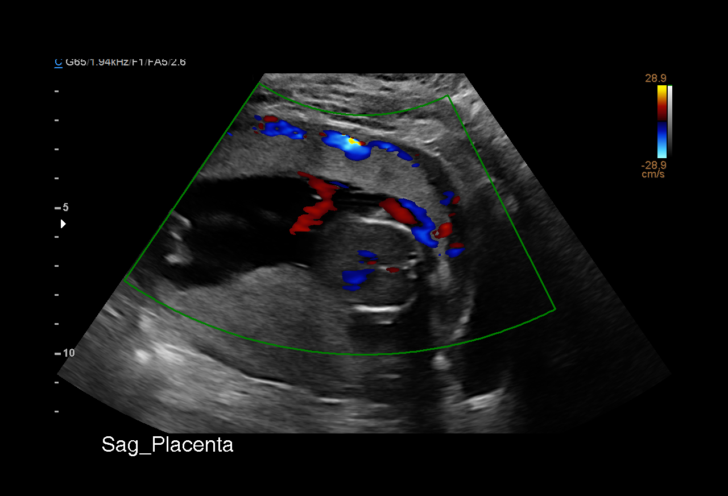
[im 16/43]
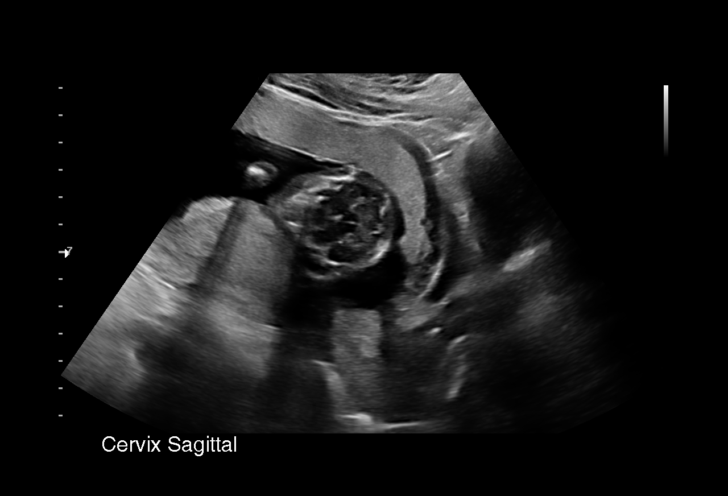
[im 19/43]
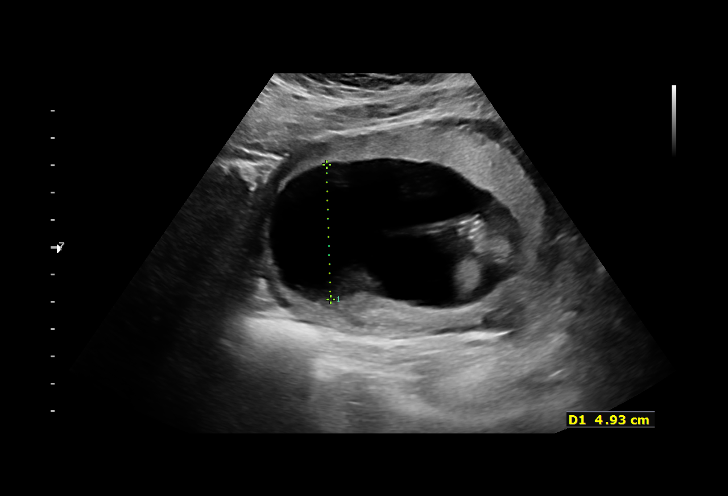
[im 22/43]
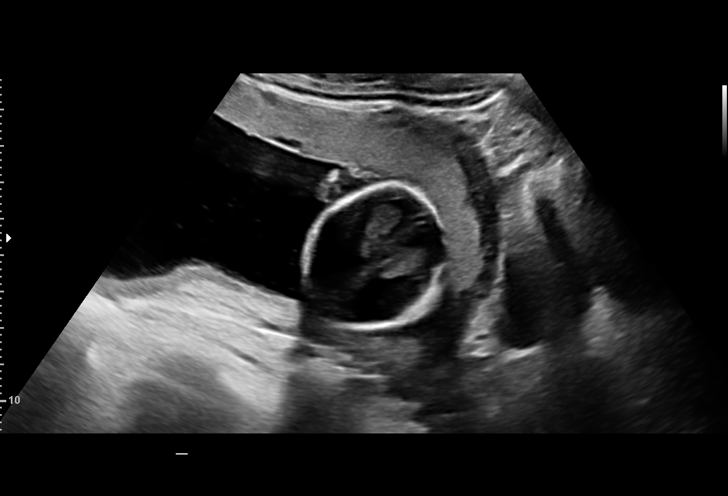
[im 24/43]
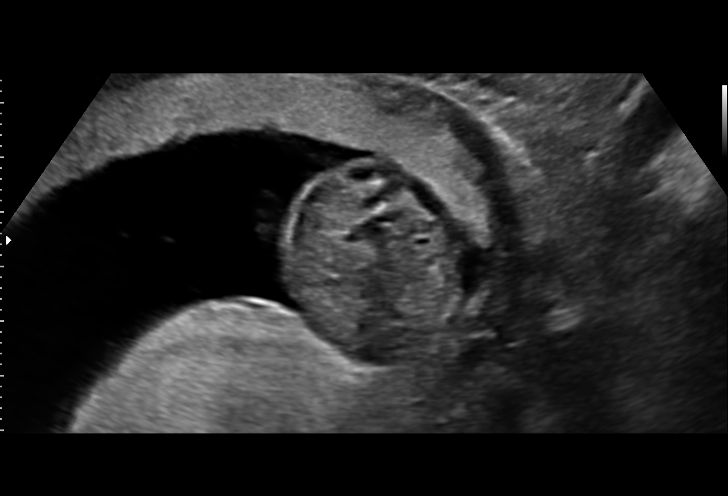
[im 27/43]
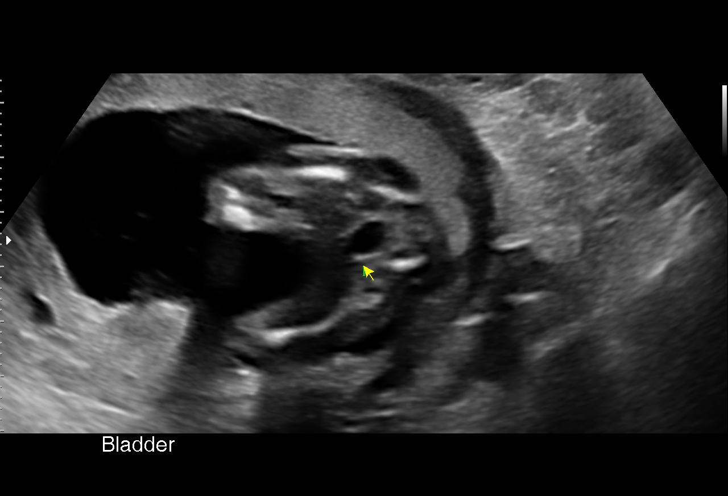
[im 30/43]
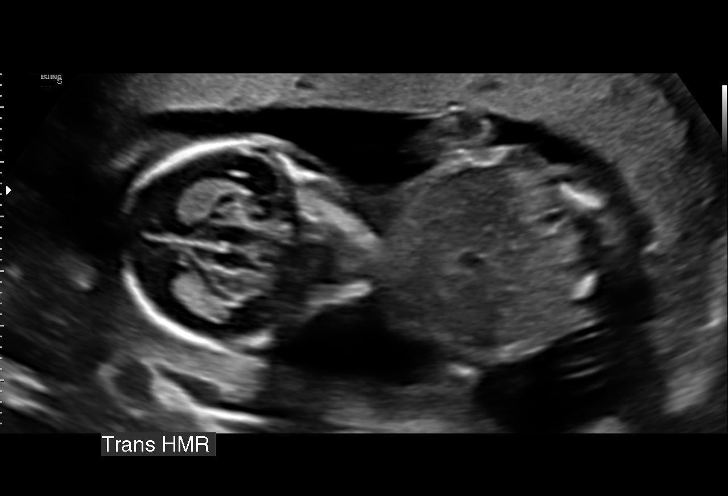
[im 33/43]
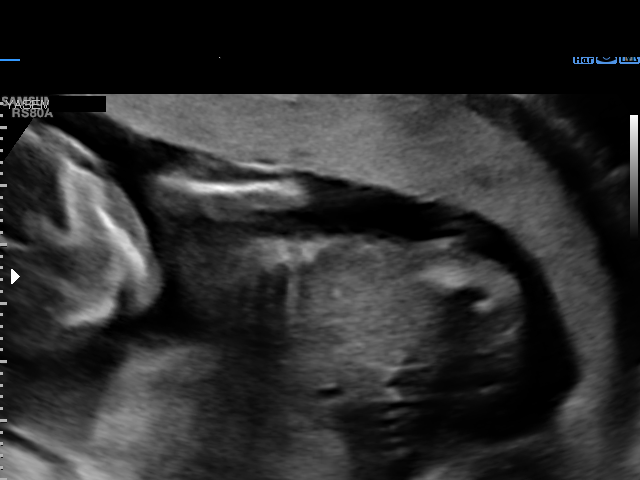
[im 36/43]
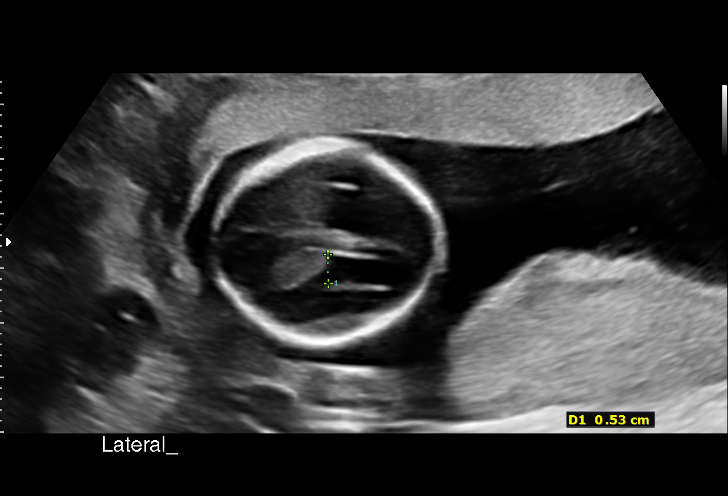
[im 39/43]
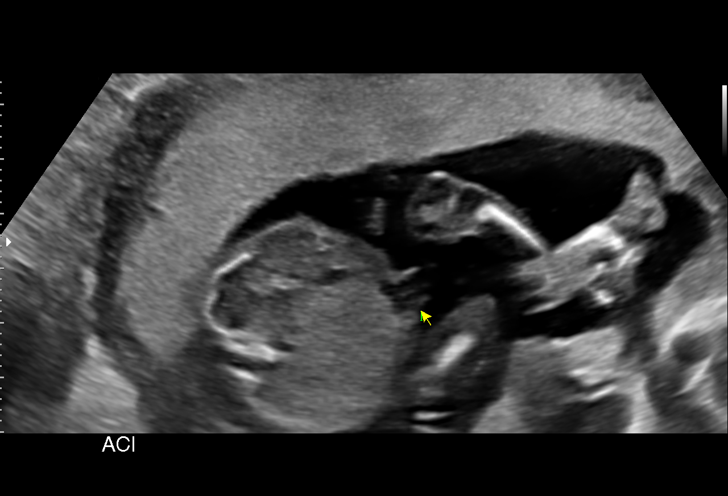
[im 43/43]
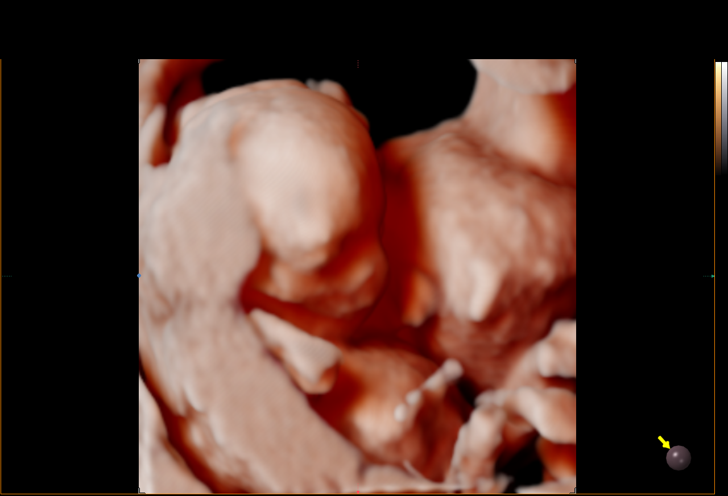

[15 of 28 positions shown; findings below may reference images not displayed]

JIANMIN

  1  US MFM OB LIMITED                    76815.01     HANN BOLLA
 ----------------------------------------------------------------------

 ----------------------------------------------------------------------
Indications

  16 weeks gestation of pregnancy
  Vaginal bleeding in pregnancy, second
  trimester
 ----------------------------------------------------------------------
Vital Signs

 BMI:
Fetal Evaluation

 Num Of Fetuses:          1
 Fetal Heart Rate(bpm):   145
 Cardiac Activity:        Observed
 Presentation:            Variable
 Placenta:                Anterior
 P. Cord Insertion:       Visualized, central

 Amniotic Fluid
 AFI FV:      Within normal limits

                             Largest Pocket(cm)

Biometry

 BPD:      37.8  mm     G. Age:  17w 4d         93  %
OB History

 Gravidity:    3         Term:   1         SAB:   1
 Living:       1
Gestational Age
 LMP:           16w 2d        Date:  09/01/18                 EDD:   06/08/19
 U/S Today:     17w 4d                                        EDD:   05/30/19
 Best:          16w 2d     Det. By:  LMP  (09/01/18)          EDD:   06/08/19
Anatomy

 Ventricles:            Appears normal         Cord Vessels:           Appears normal (3
                                                                       vessel cord)
 Choroid Plexus:        Appears normal         Kidneys:                Appear normal
 Heart:                 Appears normal         Bladder:                Appears normal
                        (4CH, axis, and
                        situs)
 Stomach:               Appears normal, left
                        sided
Cervix Uterus Adnexa

 Cervix
 Length:           3.55  cm.
 Normal appearance by transabdominal scan.

 Uterus
 No abnormality visualized.

 Cul De Sac
 No free fluid seen.
Impression

 Patient was evaluated for c/o vaginal bleeding.
 A limited ultrasound study was performed. Amniotic fluid is
 normal and good fetal activity is seen. On transabdominal
 scan, the cervix looks long and closed.
                 Jumper, Blain

## 2018-12-24 NOTE — Discharge Instructions (Signed)
Vaginal Bleeding During Pregnancy, Second Trimester ° °A small amount of bleeding (spotting) from the vagina is common during pregnancy. Sometimes the bleeding is normal and is not a sign of problems, and sometimes it is a sign of something serious. Tell your doctor about any bleeding from your vagina right away. °Follow these instructions at home: °Activity °· Follow your doctor's instructions about how active you can be. °· If needed, make plans for someone to help with your normal activities. °· Do not exercise or do activities that take a lot of effort until your doctor says that this is safe. °· Do not lift anything that is heavier than 10 lb (4.5 kg) until your doctor says that this is safe. °· Do not have sex or orgasms until your doctor says that this is safe. °Medicines °· Take over-the-counter and prescription medicines only as told by your doctor. °· Do not take aspirin. It can cause bleeding. °General instructions °· Watch your condition for any changes. °· Write down: °? The number of pads you use each day. °? How often you change pads. °? How soaked (saturated) your pads are. °· Do not use tampons. °· Do not douche. °· If you pass any tissue from your vagina, save it to show to your doctor. °· Keep all follow-up visits as told by your doctor. This is important. °Contact a doctor if: °· You have vaginal bleeding at any time during pregnancy. °· You have cramps. °· You have a fever that does not get better with medicine. °Get help right away if: °· You have very bad cramps in your back or belly (abdomen). °· You have contractions. °· You have chills. °· You pass large clots or a lot of tissue from your vagina. °· Your bleeding gets worse. °· You feel light-headed. °· You feel weak. °· You pass out (faint). °· You are leaking fluid from your vagina. °· You have a gush of fluid from your vagina. °Summary °· Sometimes vaginal bleeding during pregnancy is normal and is not a problem. Sometimes it may be a  sign of something serious. °· Tell your doctor about any bleeding from your vagina right away. °· Follow your doctor's instructions about how active you can be. You may need someone to help you with your normal activities. °This information is not intended to replace advice given to you by your health care provider. Make sure you discuss any questions you have with your health care provider. °Document Released: 03/19/2014 Document Revised: 02/03/2017 Document Reviewed: 02/03/2017 °Elsevier Interactive Patient Education © 2019 Elsevier Inc. ° °

## 2018-12-24 NOTE — MAU Provider Note (Signed)
History     CSN: [id number]  Arrival date and time: 12/24/18 [id number]   First Provider Initiated Contact with Patient 12/24/18 1051      Chief Complaint  Patient presents with  . Abdominal Pain  . Vaginal Bleeding   G3P1011 @16 .2 wks presenting with VB. Reports onset 4 days ago. Mostly brown color. Passed a ball of mucus. No recent IC. Reports intermittent LLQ pain. Occurs infrequently. Rates 4/10. No urinary sx.     OB History    Gravida  3   Para  1   Term  1   Preterm      AB  1   Living  1     SAB  1   TAB      Ectopic      Multiple  0   Live Births  1           Past Medical History:  Diagnosis Date  . SVD (spontaneous vaginal delivery) 05/13/2017    Past Surgical History:  Procedure Laterality Date  . NERVE AND TENDON REPAIR Left    arm  . tummy tuck  2004    Family History  Problem Relation Age of Onset  . Cancer Mother     Social History   Tobacco Use  . Smoking status: Never Smoker  . Smokeless tobacco: Never Used  Substance Use Topics  . Alcohol use: No  . Drug use: No    Allergies: No Known Allergies  Medications Prior to Admission  Medication Sig Dispense Refill Last Dose  . ibuprofen (ADVIL,MOTRIN) 600 MG tablet Take 1 tablet (600 mg total) by mouth every 6 (six) hours as needed. 40 tablet 1 Taking  . Prenatal Vit-Fe Fumarate-FA (PRENATAL MULTIVITAMIN) TABS tablet Take 1 tablet by mouth daily at 12 noon.   Taking  . tobramycin (TOBREX) 0.3 % ophthalmic solution Place 1 drop into the left eye every 6 (six) hours. 5 mL 0 Taking    Review of Systems  Constitutional: Negative for fever.  Gastrointestinal: Positive for abdominal pain and nausea. Negative for constipation, diarrhea and vomiting.  Genitourinary: Positive for vaginal bleeding. Negative for dysuria and urgency.   Physical Exam   Blood pressure 114/61, pulse 65, temperature 98.7 F (37.1 C), temperature source Oral, resp. rate 18, weight 76.7 kg, SpO2 100 %,  unknown if currently breastfeeding.  Physical Exam  Nursing note and vitals reviewed. Constitutional: She is oriented to person, place, and time. She appears well-developed and well-nourished. No distress.  HENT:  Head: Normocephalic and atraumatic.  Neck: Normal range of motion.  Cardiovascular: Normal rate.  Respiratory: Effort normal. No respiratory distress.  GI: Soft. She exhibits no distension and no mass. There is no abdominal tenderness. There is no rebound and no guarding.  Genitourinary:    Genitourinary Comments: External: no lesions or erythema Vagina: rugated, pink, moist, yellow/brown discharge from os Cervix ?polyp, closed/long    Musculoskeletal: Normal range of motion.  Neurological: She is alert and oriented to person, place, and time.  Skin: Skin is warm and dry.  Psychiatric: She has a normal mood and affect.  FHT 156  Results for orders placed or performed during the hospital encounter of 12/24/18 (from the past 24 hour(s))  Urinalysis, Routine w reflex microscopic     Status: Abnormal   Collection Time: 12/24/18 10:46 AM  Result Value Ref Range   Color, Urine YELLOW YELLOW   APPearance CLEAR CLEAR   Specific Gravity, Urine >1.030 (H) 1.005 - 1.030  pH 5.0 5.0 - 8.0   Glucose, UA NEGATIVE NEGATIVE mg/dL   Hgb urine dipstick SMALL (A) NEGATIVE   Bilirubin Urine NEGATIVE NEGATIVE   Ketones, ur NEGATIVE NEGATIVE mg/dL   Protein, ur NEGATIVE NEGATIVE mg/dL   Nitrite NEGATIVE NEGATIVE   Leukocytes, UA NEGATIVE NEGATIVE  Urinalysis, Microscopic (reflex)     Status: Abnormal   Collection Time: 12/24/18 10:46 AM  Result Value Ref Range   RBC / HPF 0-5 0 - 5 RBC/hpf   WBC, UA 0-5 0 - 5 WBC/hpf   Bacteria, UA RARE (A) NONE SEEN   Squamous Epithelial / LPF 0-5 0 - 5   Mucus PRESENT   Wet prep, genital     Status: Abnormal   Collection Time: 12/24/18 11:08 AM  Result Value Ref Range   Yeast Wet Prep HPF POC NONE SEEN NONE SEEN   Trich, Wet Prep NONE SEEN  NONE SEEN   Clue Cells Wet Prep HPF POC NONE SEEN NONE SEEN   WBC, Wet Prep HPF POC MANY (A) NONE SEEN   Sperm NONE SEEN    US: normal placenta, nml CL MAU Course  Procedures Orders Placed This Encounter  Procedures  . Wet prep, genital    Standing Status:   Standing    Number of Occurrences:   1    Order Specific Question:   Patient immune status    Answer:   Normal  . US MFM OB LIMITED    Placenta and cervical length    Standing Status:   Standing    Number of Occurrences:   1    Order Specific Question:   What location should the exam be performed?    Answer:   [facility] ULTRASOUND    Order Specific Question:   Symptom/Reason for Exam    Answer:   Vaginal bleeding in pregnancy [705036]  . Urinalysis, Routine w reflex microscopic    Standing Status:   Standing    Number of Occurrences:   1   MDM Chart review: no prenatal on file. Pt reports uncomplicated pregnancy. Labs and US ordered and reviewed. Spotting likely from polyp. Pt reassured. Stable for discharge home.   Assessment and Plan   1. [redacted] weeks gestation of pregnancy   2. Vaginal bleeding in pregnancy   3. Blood type, Rh positive    [facility]charge [facility] O[facility] today SAB precautions  Allergies as of 12/24/2018   No Known Allergies     Medication List    STOP taking these medications   ibuprofen 600 MG tablet Commonly known as:  ADVIL,MOTRIN     TAKE these medications   prenatal multivitamin Tabs tablet Take 1 tablet by mouth daily at 12 noon.   tobramycin 0.3 % ophthalmic solution Commonly known as:  TOBREX Place 1 drop into the left eye every 6 (six) hours.      Jacqueline Gray, CNM 12/24/2018, 11:11 AM

## 2018-12-24 NOTE — MAU Note (Addendum)
Jacqueline Gray is a 38 y.o. at [redacted]w[redacted]d here in MAU reporting:  +vaginal bleeding. Brown in color. More mucus like today. +lower abdominal cramping. Intermittent. Pinching in nature. LMP: unknown. Reports EDD 05/10/2019 Onset of complaint: x4 days Pain score: 4/10 Endorses having talked to Dr. Mindi Slicker and was told to come in for evaluation. Vitals:   12/24/18 1026  BP: 114/61  Pulse: 65  Resp: 18  Temp: 98.7 F (37.1 C)  SpO2: 100%     FHT: 156 via doppler Lab orders placed from triage: ua

## 2018-12-26 LAB — GC/CHLAMYDIA PROBE AMP (~~LOC~~) NOT AT ARMC
Chlamydia: NEGATIVE
NEISSERIA GONORRHEA: NEGATIVE

## 2019-01-12 DIAGNOSIS — Z363 Encounter for antenatal screening for malformations: Secondary | ICD-10-CM | POA: Diagnosis not present

## 2019-01-12 DIAGNOSIS — Z3A19 19 weeks gestation of pregnancy: Secondary | ICD-10-CM | POA: Diagnosis not present

## 2019-03-01 DIAGNOSIS — R3121 Asymptomatic microscopic hematuria: Secondary | ICD-10-CM | POA: Diagnosis not present

## 2019-03-14 DIAGNOSIS — Z23 Encounter for immunization: Secondary | ICD-10-CM | POA: Diagnosis not present

## 2019-03-14 DIAGNOSIS — Z3689 Encounter for other specified antenatal screening: Secondary | ICD-10-CM | POA: Diagnosis not present

## 2019-05-15 DIAGNOSIS — Z3685 Encounter for antenatal screening for Streptococcus B: Secondary | ICD-10-CM | POA: Diagnosis not present

## 2019-05-15 LAB — OB RESULTS CONSOLE GBS: GBS: NEGATIVE

## 2019-06-08 ENCOUNTER — Inpatient Hospital Stay (HOSPITAL_COMMUNITY): Payer: BC Managed Care – PPO | Admitting: Anesthesiology

## 2019-06-08 ENCOUNTER — Inpatient Hospital Stay (HOSPITAL_COMMUNITY)
Admission: AD | Admit: 2019-06-08 | Discharge: 2019-06-09 | DRG: 807 | Disposition: A | Discharge status: Home / Self Care | Payer: BC Managed Care – PPO | Attending: Obstetrics and Gynecology | Admitting: Obstetrics and Gynecology

## 2019-06-08 ENCOUNTER — Encounter (HOSPITAL_COMMUNITY): Payer: Self-pay | Admitting: Obstetrics and Gynecology

## 2019-06-08 DIAGNOSIS — Z1159 Encounter for screening for other viral diseases: Secondary | ICD-10-CM | POA: Diagnosis not present

## 2019-06-08 DIAGNOSIS — Z3A39 39 weeks gestation of pregnancy: Secondary | ICD-10-CM | POA: Diagnosis not present

## 2019-06-08 DIAGNOSIS — O26893 Other specified pregnancy related conditions, third trimester: Secondary | ICD-10-CM | POA: Diagnosis not present

## 2019-06-08 DIAGNOSIS — Z3A40 40 weeks gestation of pregnancy: Secondary | ICD-10-CM | POA: Diagnosis not present

## 2019-06-08 LAB — SARS CORONAVIRUS 2 BY RT PCR (HOSPITAL ORDER, PERFORMED IN ~~LOC~~ HOSPITAL LAB): SARS Coronavirus 2: NEGATIVE

## 2019-06-08 LAB — CBC
HCT: 42.4 % (ref 36.0–46.0)
Hemoglobin: 14 g/dL (ref 12.0–15.0)
MCH: 29.7 pg (ref 26.0–34.0)
MCHC: 33 g/dL (ref 30.0–36.0)
MCV: 89.8 fL (ref 80.0–100.0)
Platelets: 257 10*3/uL (ref 150–400)
RBC: 4.72 MIL/uL (ref 3.87–5.11)
RDW: 13.6 % (ref 11.5–15.5)
WBC: 11.4 10*3/uL — ABNORMAL HIGH (ref 4.0–10.5)
nRBC: 0 % (ref 0.0–0.2)

## 2019-06-08 LAB — TYPE AND SCREEN
ABO/RH(D): A POS
Antibody Screen: NEGATIVE

## 2019-06-08 MED ORDER — IBUPROFEN 600 MG PO TABS
600.0000 mg | ORAL_TABLET | Freq: Four times a day (QID) | ORAL | Status: DC
  Administered 2019-06-09 (×3): 600 mg via ORAL
  Filled 2019-06-08 (×3): qty 1

## 2019-06-08 MED ORDER — LACTATED RINGERS IV SOLN
500.0000 mL | Freq: Once | INTRAVENOUS | Status: AC
  Administered 2019-06-08: 500 mL via INTRAVENOUS

## 2019-06-08 MED ORDER — ZOLPIDEM TARTRATE 5 MG PO TABS: 5.0000 mg | ORAL_TABLET | Freq: Every evening | ORAL | Status: DC | PRN

## 2019-06-08 MED ORDER — OXYCODONE HCL 5 MG PO TABS: 10.0000 mg | ORAL_TABLET | ORAL | Status: DC | PRN

## 2019-06-08 MED ORDER — ACETAMINOPHEN 325 MG PO TABS: 650.0000 mg | ORAL_TABLET | ORAL | Status: DC | PRN

## 2019-06-08 MED ORDER — EPHEDRINE 5 MG/ML INJ: 10.0000 mg | INTRAVENOUS | Status: DC | PRN

## 2019-06-08 MED ORDER — PHENYLEPHRINE 40 MCG/ML (10ML) SYRINGE FOR IV PUSH (FOR BLOOD PRESSURE SUPPORT): 80.0000 ug | PREFILLED_SYRINGE | INTRAVENOUS | Status: DC | PRN

## 2019-06-08 MED ORDER — PRENATAL MULTIVITAMIN CH
1.0000 | ORAL_TABLET | Freq: Every day | ORAL | Status: DC
  Administered 2019-06-09: 1 via ORAL
  Filled 2019-06-08: qty 1

## 2019-06-08 MED ORDER — DIPHENHYDRAMINE HCL 25 MG PO CAPS: 25.0000 mg | ORAL_CAPSULE | Freq: Four times a day (QID) | ORAL | Status: DC | PRN

## 2019-06-08 MED ORDER — COCONUT OIL OIL: 1.0000 "application " | TOPICAL_OIL | Status: DC | PRN

## 2019-06-08 MED ORDER — OXYTOCIN 40 UNITS IN NORMAL SALINE INFUSION - SIMPLE MED: 2.5000 [IU]/h | INTRAVENOUS | Status: DC

## 2019-06-08 MED ORDER — ONDANSETRON HCL 4 MG/2ML IJ SOLN: 4.0000 mg | Freq: Four times a day (QID) | INTRAMUSCULAR | Status: DC | PRN

## 2019-06-08 MED ORDER — OXYCODONE HCL 5 MG PO TABS: 5.0000 mg | ORAL_TABLET | ORAL | Status: DC | PRN

## 2019-06-08 MED ORDER — OXYCODONE-ACETAMINOPHEN 5-325 MG PO TABS: 1.0000 | ORAL_TABLET | ORAL | Status: DC | PRN

## 2019-06-08 MED ORDER — SENNOSIDES-DOCUSATE SODIUM 8.6-50 MG PO TABS
2.0000 | ORAL_TABLET | ORAL | Status: DC
  Filled 2019-06-08: qty 2

## 2019-06-08 MED ORDER — LIDOCAINE HCL (PF) 1 % IJ SOLN: 30.0000 mL | INTRAMUSCULAR | Status: DC | PRN

## 2019-06-08 MED ORDER — SOD CITRATE-CITRIC ACID 500-334 MG/5ML PO SOLN: 30.0000 mL | ORAL | Status: DC | PRN

## 2019-06-08 MED ORDER — ONDANSETRON HCL 4 MG PO TABS: 4.0000 mg | ORAL_TABLET | ORAL | Status: DC | PRN

## 2019-06-08 MED ORDER — BENZOCAINE-MENTHOL 20-0.5 % EX AERO: 1.0000 "application " | INHALATION_SPRAY | CUTANEOUS | Status: DC | PRN

## 2019-06-08 MED ORDER — LACTATED RINGERS IV SOLN
INTRAVENOUS | Status: DC
  Administered 2019-06-08 (×2): via INTRAVENOUS

## 2019-06-08 MED ORDER — LIDOCAINE HCL (PF) 1 % IJ SOLN
INTRAMUSCULAR | Status: DC | PRN
  Administered 2019-06-08 (×2): 4 mL via EPIDURAL

## 2019-06-08 MED ORDER — FENTANYL-BUPIVACAINE-NACL 0.5-0.125-0.9 MG/250ML-% EP SOLN
12.0000 mL/h | EPIDURAL | Status: DC | PRN
  Filled 2019-06-08: qty 250

## 2019-06-08 MED ORDER — TERBUTALINE SULFATE 1 MG/ML IJ SOLN: 0.2500 mg | Freq: Once | INTRAMUSCULAR | Status: DC | PRN

## 2019-06-08 MED ORDER — OXYTOCIN BOLUS FROM INFUSION
500.0000 mL | Freq: Once | INTRAVENOUS | Status: AC
  Administered 2019-06-08: 500 mL via INTRAVENOUS

## 2019-06-08 MED ORDER — BUTORPHANOL TARTRATE 1 MG/ML IJ SOLN: 1.0000 mg | INTRAMUSCULAR | Status: DC | PRN

## 2019-06-08 MED ORDER — WITCH HAZEL-GLYCERIN EX PADS: 1.0000 "application " | MEDICATED_PAD | CUTANEOUS | Status: DC | PRN

## 2019-06-08 MED ORDER — LACTATED RINGERS IV SOLN: 500.0000 mL | INTRAVENOUS | Status: DC | PRN

## 2019-06-08 MED ORDER — SODIUM CHLORIDE (PF) 0.9 % IJ SOLN
INTRAMUSCULAR | Status: DC | PRN
  Administered 2019-06-08: 12 mL/h via EPIDURAL

## 2019-06-08 MED ORDER — DIPHENHYDRAMINE HCL 50 MG/ML IJ SOLN: 12.5000 mg | INTRAMUSCULAR | Status: DC | PRN

## 2019-06-08 MED ORDER — TETANUS-DIPHTH-ACELL PERTUSSIS 5-2.5-18.5 LF-MCG/0.5 IM SUSP: 0.5000 mL | Freq: Once | INTRAMUSCULAR | Status: DC

## 2019-06-08 MED ORDER — ONDANSETRON HCL 4 MG/2ML IJ SOLN: 4.0000 mg | INTRAMUSCULAR | Status: DC | PRN

## 2019-06-08 MED ORDER — LACTATED RINGERS IV SOLN: INTRAVENOUS | Status: DC

## 2019-06-08 MED ORDER — FENTANYL-BUPIVACAINE-NACL 0.5-0.125-0.9 MG/250ML-% EP SOLN: 12.0000 mL/h | EPIDURAL | Status: DC | PRN

## 2019-06-08 MED ORDER — OXYTOCIN 40 UNITS IN NORMAL SALINE INFUSION - SIMPLE MED
1.0000 m[IU]/min | INTRAVENOUS | Status: DC
  Administered 2019-06-08: 2 m[IU]/min via INTRAVENOUS
  Filled 2019-06-08: qty 1000

## 2019-06-08 MED ORDER — DIBUCAINE (PERIANAL) 1 % EX OINT: 1.0000 "application " | TOPICAL_OINTMENT | CUTANEOUS | Status: DC | PRN

## 2019-06-08 MED ORDER — OXYCODONE-ACETAMINOPHEN 5-325 MG PO TABS: 2.0000 | ORAL_TABLET | ORAL | Status: DC | PRN

## 2019-06-08 MED ORDER — SIMETHICONE 80 MG PO CHEW: 80.0000 mg | CHEWABLE_TABLET | ORAL | Status: DC | PRN

## 2019-06-08 NOTE — Anesthesia Procedure Notes (Signed)
Epidural Patient location during procedure: OB Start time: 06/08/2019 1:05 PM End time: 06/08/2019 1:15 PM  Staffing Anesthesiologist: Nolon Nations, MD Performed: anesthesiologist   Preanesthetic Checklist Completed: patient identified, pre-op evaluation, timeout performed, IV checked, risks and benefits discussed and monitors and equipment checked  Epidural Patient position: sitting Prep: site prepped and draped and DuraPrep Patient monitoring: heart rate, continuous pulse ox and blood pressure Approach: midline Location: L3-L4 Injection technique: LOR air and LOR saline  Needle:  Needle type: Tuohy  Needle gauge: 17 G Needle length: 9 cm Needle insertion depth: 6 cm Catheter type: closed end flexible Catheter size: 19 Gauge Catheter at skin depth: 11 cm Test dose: negative  Assessment Sensory level: T8 Events: blood not aspirated, injection not painful, no injection resistance, negative IV test and no paresthesia  Additional Notes Reason for block:procedure for pain

## 2019-06-08 NOTE — Progress Notes (Signed)
Patient ID: Jacqueline Gray, female   DOB: January 06, 1981, 38 y.o.   MRN: [id number]  Comfortable with epidural  AFVSS  gen NAD FHTS 120's, mod var, category 1 toco q 2-21min  SVE 7/90/0  Continue IOL. Expect SVD

## 2019-06-08 NOTE — H&P (Signed)
Jacqueline Gray is a 38 y.o. female G3P1011 at 39+ for  Elective IOL.  Pt with AMA, h/o migraine.  Relatively uncomplicated PNC.  Pt desires IOL - d/w pt r/b/a and process.    OB History    Gravida  3   Para  1   Term  1   Preterm      AB  1   Living  1     SAB  1   TAB      Ectopic      Multiple  0   Live Births  1         G1 SAB G2 SVD 7#7, female G3 present  No abn pap, last 3/17 No STD  Past Medical History:  Diagnosis Date  . SVD (spontaneous vaginal delivery) 05/13/2017   Past Surgical History:  Procedure Laterality Date  . NERVE AND TENDON REPAIR Left    arm  . tummy tuck  2004   Family History: family history includes Cancer in her mother. Social History:  reports that she has never smoked. She has never used smokeless tobacco. She reports that she does not drink alcohol or use drugs.married, [employer]  Meds PNV All NKDA     Maternal Diabetes: No Genetic Screening: Normal Maternal Ultrasounds/Referrals: Normal Fetal Ultrasounds or other Referrals:  None Maternal Substance Abuse:  No Significant Maternal Medications:  None Significant Maternal Lab Results:  Group B Strep negative Other Comments:  None  Review of Systems  Constitutional: Negative.   HENT: Negative.   Eyes: Negative.   Respiratory: Negative.   Cardiovascular: Negative.   Gastrointestinal: Negative.   Genitourinary: Negative.   Musculoskeletal: Negative.   Skin: Negative.   Neurological: Negative.   Psychiatric/Behavioral: Negative.    Maternal Medical History:  Contractions: Frequency: irregular.    Fetal activity: Perceived fetal activity is normal.    Prenatal Complications - Diabetes: none.      Blood pressure 131/79, pulse 79, temperature 98.2 F (36.8 C), resp. rate 18, height 5\' 4"  (1.626 m), weight 85.3 kg, last menstrual period 09/01/2018, SpO2 98 %, unknown if currently breastfeeding. Maternal Exam:  Uterine Assessment: Contraction strength is  moderate.  Contraction frequency is irregular.   Abdomen: Patient reports no abdominal tenderness. Fundal height is appropriate for gestation.   Estimated fetal weight is 7.5-8.5#.   Fetal presentation: vertex  Introitus: Normal vulva. Normal vagina.    Physical Exam  Constitutional: She is oriented to person, place, and time. She appears well-developed and well-nourished.  HENT:  Head: Normocephalic and atraumatic.  Cardiovascular: Normal rate and regular rhythm.  Respiratory: Effort normal and breath sounds normal. No respiratory distress. She has no wheezes.  GI: Soft. Bowel sounds are normal. She exhibits no distension. There is no abdominal tenderness.  Genitourinary:    Vulva normal.   Musculoskeletal: Normal range of motion.  Neurological: She is alert and oriented to person, place, and time.  Skin: Skin is warm and dry.  Psychiatric: She has a normal mood and affect. Her behavior is normal.    Prenatal labs: ABO, Rh: A/Positive/-- (12/26 0000) Antibody: Negative (12/26 0000) Rubella: Immune (12/26 0000) RPR: Nonreactive (12/26 0000)  HBsAg: Negative (12/26 0000)  HIV: Non-reactive (12/26 0000)  GBS: Negative (06/29 0000)  Hgb 13.6/Plt 453/Ur Cx neg/GC neg/Chl neg/ Varicella immune/ Hgb electro WNL/nl NT - panorama WNL/glucola 130  Nl anat, ant plac (female)  Assessment/Plan: 38yo G3P1011 at 39 for IOL gbbs neg - no prophylaxis Pitocin and AROM  for IOL Epidural prn Expect SVD  Jacqueline Gray 06/08/2019, 9:22 AM

## 2019-06-08 NOTE — Anesthesia Preprocedure Evaluation (Signed)
Anesthesia Evaluation  Patient identified by MRN, date of birth, ID band Patient awake    Reviewed: Allergy & Precautions, H&P , Patient's Chart, lab work & pertinent test results  Airway Mallampati: I  TM Distance: >3 FB Neck ROM: full    Dental no notable dental hx. (+) Teeth Intact   Pulmonary neg pulmonary ROS,    Pulmonary exam normal breath sounds clear to auscultation       Cardiovascular negative cardio ROS Normal cardiovascular exam Rhythm:regular Rate:Normal     Neuro/Psych negative neurological ROS  negative psych ROS   GI/Hepatic negative GI ROS, Neg liver ROS,   Endo/Other  negative endocrine ROS  Renal/GU negative Renal ROS  negative genitourinary   Musculoskeletal negative musculoskeletal ROS (+)   Abdominal (+) + obese,   Peds  Hematology negative hematology ROS (+)   Anesthesia Other Findings   Reproductive/Obstetrics (+) Pregnancy                             Anesthesia Physical Anesthesia Plan  ASA: II  Anesthesia Plan: Epidural   Post-op Pain Management:    Induction:   PONV Risk Score and Plan:   Airway Management Planned:   Additional Equipment:   Intra-op Plan:   Post-operative Plan:   Informed Consent: I have reviewed the patients History and Physical, chart, labs and discussed the procedure including the risks, benefits and alternatives for the proposed anesthesia with the patient or authorized representative who has indicated his/her understanding and acceptance.       Plan Discussed with:   Anesthesia Plan Comments:         Anesthesia Quick Evaluation  

## 2019-06-08 NOTE — Progress Notes (Signed)
Patient ID: Jacqueline Gray, female   DOB: Feb 15, 1981, 38 y.o.   MRN: [id number]   No c/o's  AFVSS gen NAD  FHTs 125's, mod var, + accels, + scalp stim, category 1 toco irr  SVE 4/50/-2 AROM for clear fluid, w/o diff/comp  Continue IOL

## 2019-06-08 NOTE — Lactation Note (Signed)
This note was copied from a baby's chart. Lactation Consultation Note  Patient Name: Jacqueline Gray Date: 06/08/2019 Reason for consult: Initial assessment;Term P2, 4 hour female infant. In house interpreter Christena Deem was  used and Mom stated she is bi-lingual and doesn't want to use interpreter services. Per mom, breastfeeding is going well, mom is experienced at breastfeeding she breastfeed her two year old for 21 months. Mom demonstrated hand expression and easily expressed colostrum. Per mom, she last latched infant at 9:20 pm for 20 minutes prior to [facility] entering room, infant is currently asleep in mom's arms.  Mom is a active  [place] participant in [facility] and she has DEBP at home. Mom knows to breastfeed infant according hunger cues, 8 to 12 times within 24 hours and on demand. LC discussed STS. LC discussed I & O. Mom made aware of O/P services, breastfeeding support groups, community resources, and our phone # for post-discharge questions.    Maternal Data Formula Feeding for Exclusion: No Has patient been taught Hand Expression?: Yes Does the patient have breastfeeding experience prior to this delivery?: Yes  Feeding Feeding Type: Breast Fed  LATCH Score                   Interventions Interventions: Breast feeding basics reviewed;Skin to skin;Hand express  Lactation Tools Discussed/Used WIC Program: Yes   Consult Status Consult Status: Follow-up Date: 06/09/19 Follow-up type: In-patient    Vicente Serene 06/08/2019, 9:57 PM

## 2019-06-08 NOTE — Plan of Care (Signed)
Postpartum poc reviewed

## 2019-06-09 LAB — CBC
HCT: 37.6 % (ref 36.0–46.0)
Hemoglobin: 12.4 g/dL (ref 12.0–15.0)
MCH: 29.5 pg (ref 26.0–34.0)
MCHC: 33 g/dL (ref 30.0–36.0)
MCV: 89.5 fL (ref 80.0–100.0)
Platelets: 229 10*3/uL (ref 150–400)
RBC: 4.2 MIL/uL (ref 3.87–5.11)
RDW: 13.6 % (ref 11.5–15.5)
WBC: 12.5 10*3/uL — ABNORMAL HIGH (ref 4.0–10.5)
nRBC: 0 % (ref 0.0–0.2)

## 2019-06-09 LAB — ABO/RH: ABO/RH(D): A POS

## 2019-06-09 LAB — RPR: RPR Ser Ql: NONREACTIVE

## 2019-06-09 MED ORDER — IBUPROFEN 600 MG PO TABS: 600.0000 mg | ORAL_TABLET | Freq: Four times a day (QID) | ORAL | 0 refills | Status: DC

## 2019-06-09 MED ORDER — ACETAMINOPHEN 325 MG PO TABS: 650.0000 mg | ORAL_TABLET | ORAL | 0 refills | Status: DC | PRN

## 2019-06-09 NOTE — Anesthesia Postprocedure Evaluation (Signed)
Anesthesia Post Note  Patient: Jacqueline Gray  Procedure(s) Performed: AN AD HOC LABOR EPIDURAL     Patient location during evaluation: Mother Baby Anesthesia Type: Epidural Level of consciousness: awake and alert Pain management: pain level controlled Vital Signs Assessment: post-procedure vital signs reviewed and stable Respiratory status: spontaneous breathing, nonlabored ventilation and respiratory function stable Cardiovascular status: stable Postop Assessment: no headache, no backache, epidural receding, no apparent nausea or vomiting, patient able to bend at knees, adequate PO intake and able to ambulate Anesthetic complications: no    Last Vitals:  Vitals:   06/09/19 0157 06/09/19 0617  BP: 103/64 124/73  Pulse: 69 63  Resp: 16 16  Temp: 36.9 C 36.7 C  SpO2: 96% 97%    Last Pain:  Vitals:   06/09/19 0617  TempSrc: Oral  PainSc:    Pain Goal:                   Jabier Mutton

## 2019-06-09 NOTE — Progress Notes (Signed)
Post Partum Day 1 Subjective: no complaints, up ad lib and tolerating PO.  Requests d/c home this PM  Objective: Blood pressure 124/73, pulse 63, temperature 98.1 F (36.7 C), temperature source Oral, resp. rate 16, height 5\' 4"  (1.626 m), weight 85.3 kg, last menstrual period 09/01/2018, SpO2 97 %, unknown if currently breastfeeding.  Physical Exam:  General: alert and cooperative Lochia: appropriate Uterine Fundus: firm   Recent Labs    06/08/19 0903 06/09/19 0528  HGB 14.0 12.4  HCT 42.4 37.6    Assessment/Plan: Discharge home if baby able to go   LOS: 1 day   Jacqueline Gray 06/09/2019, 9:34 AM

## 2019-06-09 NOTE — Discharge Summary (Signed)
OB Discharge Summary     Patient Name: Jacqueline Gray DOB: 05/04/1981 MRN: [id number]  Date of admission: 06/08/2019 Delivering MD: Sherian ReinBOVARD-STUCKERT, JODY   Date of discharge: 06/09/2019  Admitting diagnosis: INDUCTION Intrauterine pregnancy: 8570w0d     Secondary diagnosis:  Principal Problem:   SVD (spontaneous vaginal delivery) Active Problems:   Indication for care in labor or delivery  Additional problems: none     Discharge diagnosis: Term Pregnancy Delivered                                                                                                Post partum procedures:NONE  Augmentation: AROM and Pitocin  Complications: None  Hospital course:  Induction of Labor With Vaginal Delivery   38 y.o. yo [id number] at 4570w0d was admitted to the hospital 06/08/2019 for induction of labor.  Indication for induction: Favorable cervix at term.  Patient had an uncomplicated labor course as follows: Membrane Rupture Time/Date: 10:55 AM ,06/08/2019   Intrapartum Procedures: Episiotomy: None [1]                                         Lacerations:  Labial [10]  Patient had delivery of a Viable infant.  Information for the patient's newborn:  Andy GaussLopez Gray, Boy Vondra [[id number]][[id number]]  Delivery Method: Vaginal, Spontaneous(Filed from Delivery Summary)    06/08/2019  Details of delivery can be found in separate delivery note.  Patient had a routine postpartum course. Patient is discharged home 06/09/19.  Physical exam  Vitals:   06/08/19 2010 06/08/19 2140 06/09/19 0157 06/09/19 0617  BP: (!) 104/59 114/68 103/64 124/73  Pulse: 75 76 69 63  Resp: 18 18 16 16   Temp: 98.5 F (36.9 C) 98.3 F (36.8 C) 98.5 F (36.9 C) 98.1 F (36.7 C)  TempSrc: Oral Oral Oral Oral  SpO2: 100% 97% 96% 97%  Weight:      Height:       General: alert and cooperative Lochia: appropriate Uterine Fundus: firm  Labs: Lab Results  Component Value Date   WBC 12.5 (H) 06/09/2019   HGB 12.4  06/09/2019   HCT 37.6 06/09/2019   MCV 89.5 06/09/2019   PLT 229 06/09/2019   CMP Latest Ref Rng & Units 01/04/2018  Glucose 70 - 99 mg/dL 78  BUN 6 - 23 mg/dL 13  Creatinine 6.960.40 - 2.951.20 mg/dL 2.840.60  Sodium 132135 - 440145 mEq/L 141  Potassium 3.5 - 5.1 mEq/L 3.7  Chloride 96 - 112 mEq/L 109  CO2 19 - 32 mEq/L 28  Calcium 8.4 - 10.5 mg/dL 9.0  Total Protein 6.0 - 8.3 g/dL 6.9  Total Bilirubin 0.2 - 1.2 mg/dL 0.4  Alkaline Phos 39 - 117 U/L 83  AST 0 - 37 U/L 12  ALT 0 - 35 U/L 13    Discharge instruction: per After Visit Summary and "Baby and Me Booklet".  After visit meds:  Allergies as of 06/09/2019      Reactions   Other  Shortness Of Breath   Spicy peppers      Medication List    TAKE these medications   acetaminophen 325 MG tablet Commonly known as: Tylenol Take 2 tablets (650 mg total) by mouth every 4 (four) hours as needed (for pain scale < 4).   ibuprofen 600 MG tablet Commonly known as: ADVIL Take 1 tablet (600 mg total) by mouth every 6 (six) hours.   prenatal multivitamin Tabs tablet Take 1 tablet by mouth daily at 12 noon.       Diet: routine diet  Activity: Advance as tolerated. Pelvic rest for 6 weeks.   Outpatient follow up:6 weeks Follow up Appt:No future appointments. Follow up Visit:No follow-ups on file.  Postpartum contraception: Undecided  Newborn Data: Live born female  Birth Weight: 8 lb 7.8 oz (3850 g) APGAR: 48, 9  Newborn Delivery   Birth date/time: 06/08/2019 17:43:00 Delivery type: Vaginal, Spontaneous      Baby Feeding: Breast Disposition:home with mother Plans circumcision in the office  06/09/2019 Logan Bores, MD

## 2019-07-19 DIAGNOSIS — Z3009 Encounter for other general counseling and advice on contraception: Secondary | ICD-10-CM | POA: Diagnosis not present

## 2019-07-19 DIAGNOSIS — Z1389 Encounter for screening for other disorder: Secondary | ICD-10-CM | POA: Diagnosis not present

## 2019-08-04 ENCOUNTER — Other Ambulatory Visit: Payer: Self-pay

## 2019-08-04 ENCOUNTER — Ambulatory Visit (INDEPENDENT_AMBULATORY_CARE_PROVIDER_SITE_OTHER): Payer: BC Managed Care – PPO | Admitting: Medical

## 2019-08-04 ENCOUNTER — Encounter: Payer: Self-pay | Admitting: Medical

## 2019-08-04 VITALS — BP 122/72 | HR 72 | Temp 96.9°F | Ht 64.0 in | Wt 170.0 lb

## 2019-08-04 DIAGNOSIS — Z23 Encounter for immunization: Secondary | ICD-10-CM

## 2019-08-04 DIAGNOSIS — Z0000 Encounter for general adult medical examination without abnormal findings: Secondary | ICD-10-CM | POA: Diagnosis not present

## 2019-08-04 NOTE — Progress Notes (Signed)
Subjective:    Patient ID: Jacqueline Gray, female    DOB: [birth year], 38 y.o.   MRN: [id number]  HPI  Pt her for cpe/wellness.  Pt is breast feeding so difficult to fast. But aware needs to be fasting. She ates this morning. Will put in future labs for next week.  Pt is active taking care of her 61 month old. Has new born also. No caffeine. Pt is eating healthy per her report.  Pt got flu vaccine today.    Review of Systems  Constitutional: Negative for chills, fatigue and fever.  HENT: Negative for congestion, hearing loss, postnasal drip, sinus pressure and sinus pain.   Respiratory: Negative for cough, chest tightness, shortness of breath and wheezing.   Cardiovascular: Negative for chest pain and palpitations.  Gastrointestinal: Negative for abdominal pain, blood in stool, nausea and vomiting.  Genitourinary: Negative for dysuria, frequency, menstrual problem, pelvic pain, urgency and vaginal pain.  Musculoskeletal: Negative for back pain.  Skin: Negative for rash.  Neurological: Negative for dizziness, seizures, speech difficulty, weakness, numbness and headaches.  Hematological: Negative for adenopathy. Does not bruise/bleed easily.  Psychiatric/Behavioral: Negative for behavioral problems and confusion.    Past Medical History:  Diagnosis Date  . SVD (spontaneous vaginal delivery) 05/13/2017  . SVD (spontaneous vaginal delivery) 06/08/2019     Social History   Socioeconomic History  . Marital status: Married    Spouse name: Not on file  . Number of children: Not on file  . Years of education: Not on file  . Highest education level: Not on file  Occupational History  . Not on file  Social Needs  . Financial resource strain: Not on file  . Food insecurity    Worry: Not on file    Inability: Not on file  . Transportation needs    Medical: Not on file    Non-medical: Not on file  Tobacco Use  . Smoking status: Never Smoker  . Smokeless tobacco: Never  Used  Substance and Sexual Activity  . Alcohol use: No  . Drug use: No  . Sexual activity: Yes  Lifestyle  . Physical activity    Days per week: Not on file    Minutes per session: Not on file  . Stress: Not on file  Relationships  . Social Herbalist on phone: Not on file    Gets together: Not on file    Attends religious service: Not on file    Active member of club or organization: Not on file    Attends meetings of clubs or organizations: Not on file    Relationship status: Not on file  . Intimate partner violence    Fear of current or ex partner: Not on file    Emotionally abused: Not on file    Physically abused: Not on file    Forced sexual activity: Not on file  Other Topics Concern  . Not on file  Social History Narrative  . Not on file    Past Surgical History:  Procedure Laterality Date  . NERVE AND TENDON REPAIR Left    arm  . tummy tuck  2004    Family History  Problem Relation Age of Onset  . Cancer Mother     Allergies  Allergen Reactions  . Other Shortness Of Breath    Spicy peppers    Current Outpatient Medications on File Prior to Visit  Medication Sig Dispense Refill  . Prenatal Vit-Fe Fumarate-FA (  PRENATAL MULTIVITAMIN) TABS tablet Take 1 tablet by mouth daily at 12 noon.     No current facility-administered medications on file prior to visit.     There were no vitals taken for this visit.       Objective:   Physical Exam  General Mental Status- Alert. General Appearance- Not in acute distress.   Skin General: Color- Normal Color. Moisture- Normal Moisture.  Neck Carotid Arteries- Normal color. Moisture- Normal Moisture. No carotid bruits. No JVD.  Chest and Lung Exam Auscultation: Breath Sounds:-Normal.  Cardiovascular Auscultation:Rythm- Regular. Murmurs & Other Heart Sounds:Auscultation of the heart reveals- No Murmurs.  Abdomen Inspection:-Inspeection Normal. Palpation/Percussion:Note:No mass. Palpation  and Percussion of the abdomen reveal- Non Tender, Non Distended + BS, no rebound or guarding.   Neurologic Cranial Nerve exam:- CN III-XII intact(No nystagmus), symmetric smile.  Finger to Nose:- Normal/Intact Strength:- 5/5 equal and symmetric strength both upper and lower extremities.      Assessment & Plan:  For you wellness exam today I have ordered cbc, cmp, and lipid panel.  Flu vaccine today.  Recommend exercise and healthy diet.  We will let you know lab results as they come in.  Follow up date appointment will be determined after lab review.    Esperanza RichtersEdward Marlana Mckowen, PA-C

## 2019-08-04 NOTE — Patient Instructions (Addendum)
For you wellness exam today I have ordered cbc, cmp, and lipid panel.  Flu vaccine today.  Recommend exercise and healthy diet.  We will let you know lab results as they come in.  Follow up date appointment will be determined after lab review.    Preventive Care 12-39 Years Old, Female Preventive care refers to visits with your health care provider and lifestyle choices that can promote health and wellness. This includes:  A yearly physical exam. This may also be called an annual well check.  Regular dental visits and eye exams.  Immunizations.  Screening for certain conditions.  Healthy lifestyle choices, such as eating a healthy diet, getting regular exercise, not using drugs or products that contain nicotine and tobacco, and limiting alcohol use. What can I expect for my preventive care visit? Physical exam Your health care provider will check your:  Height and weight. This may be used to calculate body mass index (BMI), which tells if you are at a healthy weight.  Heart rate and blood pressure.  Skin for abnormal spots. Counseling Your health care provider may ask you questions about your:  Alcohol, tobacco, and drug use.  Emotional well-being.  Home and relationship well-being.  Sexual activity.  Eating habits.  Work and work Statistician.  Method of birth control.  Menstrual cycle.  Pregnancy history. What immunizations do I need?  Influenza (flu) vaccine  This is recommended every year. Tetanus, diphtheria, and pertussis (Tdap) vaccine  You may need a Td booster every 10 years. Varicella (chickenpox) vaccine  You may need this if you have not been vaccinated. Human papillomavirus (HPV) vaccine  If recommended by your health care provider, you may need three doses over 6 months. Measles, mumps, and rubella (MMR) vaccine  You may need at least one dose of MMR. You may also need a second dose. Meningococcal conjugate (MenACWY) vaccine  One  dose is recommended if you are age 31-21 years and a first-year college student living in a residence hall, or if you have one of several medical conditions. You may also need additional booster doses. Pneumococcal conjugate (PCV13) vaccine  You may need this if you have certain conditions and were not previously vaccinated. Pneumococcal polysaccharide (PPSV23) vaccine  You may need one or two doses if you smoke cigarettes or if you have certain conditions. Hepatitis A vaccine  You may need this if you have certain conditions or if you travel or work in places where you may be exposed to hepatitis A. Hepatitis B vaccine  You may need this if you have certain conditions or if you travel or work in places where you may be exposed to hepatitis B. Haemophilus influenzae type b (Hib) vaccine  You may need this if you have certain conditions. You may receive vaccines as individual doses or as more than one vaccine together in one shot (combination vaccines). Talk with your health care provider about the risks and benefits of combination vaccines. What tests do I need?  Blood tests  Lipid and cholesterol levels. These may be checked every 5 years starting at age 95.  Hepatitis C test.  Hepatitis B test. Screening  Diabetes screening. This is done by checking your blood sugar (glucose) after you have not eaten for a while (fasting).  Sexually transmitted disease (STD) testing.  BRCA-related cancer screening. This may be done if you have a family history of breast, ovarian, tubal, or peritoneal cancers.  Pelvic exam and Pap test. This may be done every  3 years starting at age 78. Starting at age 53, this may be done every 5 years if you have a Pap test in combination with an HPV test. Talk with your health care provider about your test results, treatment options, and if necessary, the need for more tests. Follow these instructions at home: Eating and drinking   Eat a diet that includes  fresh fruits and vegetables, whole grains, lean protein, and low-fat dairy.  Take vitamin and mineral supplements as recommended by your health care provider.  Do not drink alcohol if: ? Your health care provider tells you not to drink. ? You are pregnant, may be pregnant, or are planning to become pregnant.  If you drink alcohol: ? Limit how much you have to 0-1 drink a day. ? Be aware of how much alcohol is in your drink. In the [place] one drink equals one 12 oz bottle of beer (355 mL), one 5 oz glass of wine (148 mL), or one 1 oz glass of hard liquor (44 mL). Lifestyle  Take daily care of your teeth and gums.  Stay active. Exercise for at least 30 minutes on 5 or more days each week.  Do not use any products that contain nicotine or tobacco, such as cigarettes, e-cigarettes, and chewing tobacco. If you need help quitting, ask your health care provider.  If you are sexually active, practice safe sex. Use a condom or other form of birth control (contraception) in order to prevent pregnancy and STIs (sexually transmitted infections). If you plan to become pregnant, see your health care provider for a preconception visit. What's next?  Visit your health care provider once a year for a well check visit.  Ask your health care provider how often you should have your eyes and teeth checked.  Stay up to date on all vaccines. This information is not intended to replace advice given to you by your health care provider. Make sure you discuss any questions you have with your health care provider. Document Released: 12/29/2001 Document Revised: 07/14/2018 Document Reviewed: 07/14/2018 Elsevier Patient Education  2020 [employer].

## 2019-08-08 ENCOUNTER — Telehealth: Payer: Self-pay | Admitting: *Deleted

## 2019-08-08 ENCOUNTER — Telehealth: Payer: Self-pay | Admitting: Medical

## 2019-08-08 DIAGNOSIS — Z0000 Encounter for general adult medical examination without abnormal findings: Secondary | ICD-10-CM

## 2019-08-08 NOTE — Telephone Encounter (Signed)
Jacqueline Gray -- pt has lab appt on Friday, 9/25 but I do not see any future orders in Epic.  Can you place future orders please?

## 2019-08-08 NOTE — Telephone Encounter (Signed)
Future labs placed. 

## 2019-08-08 NOTE — Telephone Encounter (Signed)
I placed the future labs.  Thanks for catching that.

## 2019-08-11 ENCOUNTER — Telehealth: Payer: Self-pay | Admitting: Medical

## 2019-08-11 ENCOUNTER — Other Ambulatory Visit: Payer: Self-pay

## 2019-08-11 ENCOUNTER — Other Ambulatory Visit (INDEPENDENT_AMBULATORY_CARE_PROVIDER_SITE_OTHER): Payer: BC Managed Care – PPO

## 2019-08-11 DIAGNOSIS — Z0000 Encounter for general adult medical examination without abnormal findings: Secondary | ICD-10-CM

## 2019-08-11 LAB — COMPREHENSIVE METABOLIC PANEL
ALT: 21 U/L (ref 0–35)
AST: 14 U/L (ref 0–37)
Albumin: 4.2 g/dL (ref 3.5–5.2)
Alkaline Phosphatase: 90 U/L (ref 39–117)
BUN: 17 mg/dL (ref 6–23)
CO2: 26 mEq/L (ref 19–32)
Calcium: 9.3 mg/dL (ref 8.4–10.5)
Chloride: 106 mEq/L (ref 96–112)
Creatinine, Ser: 0.64 mg/dL (ref 0.40–1.20)
GFR: 103.01 mL/min (ref >60.00)
Glucose, Bld: 84 mg/dL (ref 70–99)
Potassium: 4.2 mEq/L (ref 3.5–5.1)
Sodium: 140 mEq/L (ref 135–145)
Total Bilirubin: 0.3 mg/dL (ref 0.2–1.2)
Total Protein: 6.8 g/dL (ref 6.0–8.3)

## 2019-08-11 LAB — CBC WITH DIFFERENTIAL/PLATELET
Basophils Absolute: 0.1 10*3/uL (ref 0.0–0.1)
Basophils Relative: 0.6 % (ref 0.0–3.0)
Eosinophils Absolute: 0.1 10*3/uL (ref 0.0–0.7)
Eosinophils Relative: 0.9 % (ref 0.0–5.0)
HCT: 42.1 % (ref 36.0–46.0)
Hemoglobin: 13.9 g/dL (ref 12.0–15.0)
Lymphocytes Relative: 36.6 % (ref 12.0–46.0)
Lymphs Abs: 3.8 10*3/uL (ref 0.7–4.0)
MCHC: 33.1 g/dL (ref 30.0–36.0)
MCV: 89.2 fl (ref 78.0–100.0)
Monocytes Absolute: 0.5 10*3/uL (ref 0.1–1.0)
Monocytes Relative: 5.1 % (ref 3.0–12.0)
Neutro Abs: 6 10*3/uL (ref 1.4–7.7)
Neutrophils Relative %: 56.8 % (ref 43.0–77.0)
Platelets: 375 10*3/uL (ref 150.0–400.0)
RBC: 4.73 Mil/uL (ref 3.87–5.11)
RDW: 13.3 % (ref 11.5–15.5)
WBC: 10.5 10*3/uL (ref 4.0–10.5)

## 2019-08-11 LAB — LIPID PANEL
Cholesterol: 179 mg/dL (ref 0–200)
HDL: 47.9 mg/dL (ref >39.00)
LDL Cholesterol: 95 mg/dL (ref 0–99)
NonHDL: 131.42
Total CHOL/HDL Ratio: 4
Triglycerides: 180 mg/dL — ABNORMAL HIGH (ref 0.0–149.0)
VLDL: 36 mg/dL (ref 0.0–40.0)

## 2019-08-11 NOTE — Telephone Encounter (Signed)
Pt dropped off document to be filled out by provider ([place] form 2 pages) Pt would like to be called at [phone] when document ready to pick up. Document put at front office tray under providers name.

## 2019-08-16 ENCOUNTER — Telehealth: Payer: Self-pay | Admitting: Medical

## 2019-08-16 NOTE — Telephone Encounter (Signed)
Pt physical form filled out. She dropped from off after exam. She need waist measurement done. Then can fax over.

## 2019-08-17 NOTE — Telephone Encounter (Signed)
Form completed and faxed by Rod Holler. Pt notified.

## 2019-12-06 DIAGNOSIS — Z01419 Encounter for gynecological examination (general) (routine) without abnormal findings: Secondary | ICD-10-CM | POA: Diagnosis not present

## 2019-12-06 DIAGNOSIS — Z1389 Encounter for screening for other disorder: Secondary | ICD-10-CM | POA: Diagnosis not present

## 2019-12-06 DIAGNOSIS — Z124 Encounter for screening for malignant neoplasm of cervix: Secondary | ICD-10-CM | POA: Diagnosis not present

## 2019-12-06 DIAGNOSIS — Z130 Encounter for screening for diseases of the blood and blood-forming organs and certain disorders involving the immune mechanism: Secondary | ICD-10-CM | POA: Diagnosis not present

## 2019-12-06 DIAGNOSIS — Z3A28 28 weeks gestation of pregnancy: Secondary | ICD-10-CM | POA: Diagnosis not present

## 2019-12-06 DIAGNOSIS — Z1151 Encounter for screening for human papillomavirus (HPV): Secondary | ICD-10-CM | POA: Diagnosis not present

## 2020-06-25 LAB — OB RESULTS CONSOLE HIV ANTIBODY (ROUTINE TESTING): HIV: NONREACTIVE

## 2020-06-25 LAB — OB RESULTS CONSOLE HEPATITIS B SURFACE ANTIGEN: Hepatitis B Surface Ag: NEGATIVE

## 2020-06-25 LAB — OB RESULTS CONSOLE GC/CHLAMYDIA
Chlamydia: NEGATIVE
Gonorrhea: NEGATIVE

## 2020-06-25 LAB — OB RESULTS CONSOLE RPR: RPR: NONREACTIVE

## 2020-06-25 LAB — OB RESULTS CONSOLE RUBELLA ANTIBODY, IGM: Rubella: IMMUNE

## 2020-06-25 LAB — OB RESULTS CONSOLE ABO/RH: RH Type: POSITIVE

## 2020-06-25 LAB — OB RESULTS CONSOLE ANTIBODY SCREEN: Antibody Screen: NEGATIVE

## 2020-09-11 ENCOUNTER — Other Ambulatory Visit: Payer: Self-pay | Admitting: Obstetrics and Gynecology

## 2020-09-11 DIAGNOSIS — Q040 Congenital malformations of corpus callosum: Secondary | ICD-10-CM

## 2020-09-19 ENCOUNTER — Encounter: Payer: Self-pay | Admitting: *Deleted

## 2020-09-23 ENCOUNTER — Ambulatory Visit: Payer: BC Managed Care – PPO | Attending: Obstetrics and Gynecology | Admitting: Genetic Counselor

## 2020-09-23 ENCOUNTER — Other Ambulatory Visit: Payer: Self-pay

## 2020-09-23 ENCOUNTER — Encounter: Payer: Self-pay | Admitting: *Deleted

## 2020-09-23 ENCOUNTER — Ambulatory Visit: Payer: BC Managed Care – PPO | Admitting: *Deleted

## 2020-09-23 ENCOUNTER — Ambulatory Visit: Payer: BC Managed Care – PPO | Attending: Obstetrics and Gynecology

## 2020-09-23 VITALS — BP 121/63 | HR 75

## 2020-09-23 DIAGNOSIS — Z315 Encounter for genetic counseling: Secondary | ICD-10-CM

## 2020-09-23 DIAGNOSIS — O350XX1 Maternal care for (suspected) central nervous system malformation in fetus, fetus 1: Secondary | ICD-10-CM

## 2020-09-23 DIAGNOSIS — O283 Abnormal ultrasonic finding on antenatal screening of mother: Secondary | ICD-10-CM

## 2020-09-23 DIAGNOSIS — O09522 Supervision of elderly multigravida, second trimester: Secondary | ICD-10-CM | POA: Diagnosis not present

## 2020-09-23 DIAGNOSIS — Q040 Congenital malformations of corpus callosum: Secondary | ICD-10-CM | POA: Insufficient documentation

## 2020-09-23 DIAGNOSIS — Z3A24 24 weeks gestation of pregnancy: Secondary | ICD-10-CM

## 2020-09-23 DIAGNOSIS — Z363 Encounter for antenatal screening for malformations: Secondary | ICD-10-CM

## 2020-09-23 DIAGNOSIS — O3501X1 Maternal care for (suspected) central nervous system malformation or damage in fetus, agenesis of the corpus callosum, fetus 1: Secondary | ICD-10-CM

## 2020-09-23 IMAGING — US US MFM OB DETAIL+14 WK
1 series · 12 of 28 positions shown · non-contrast
Comparison: none

[Series 1: us mfm ob detail+14 wk · 96 acquisitions, 12 frames shown]
[im 4/96]
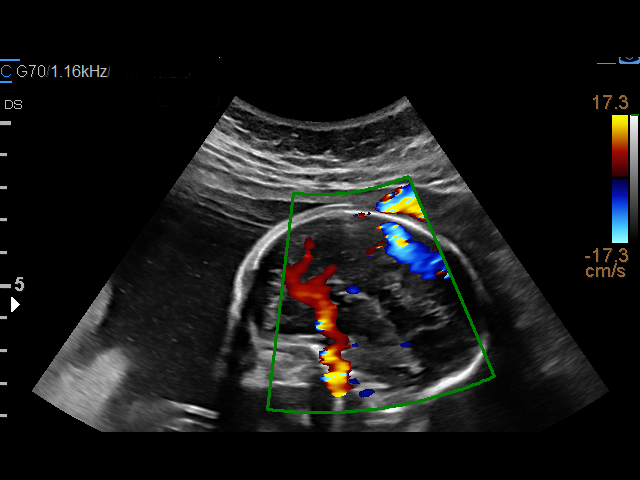
[im 11/96]
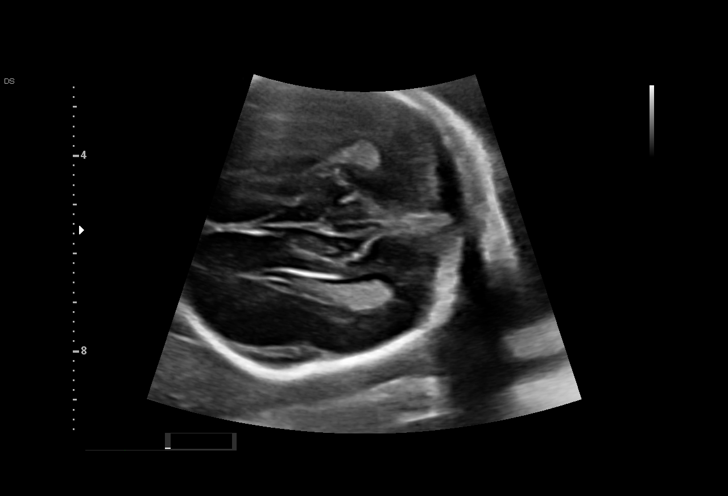
[im 18/96]
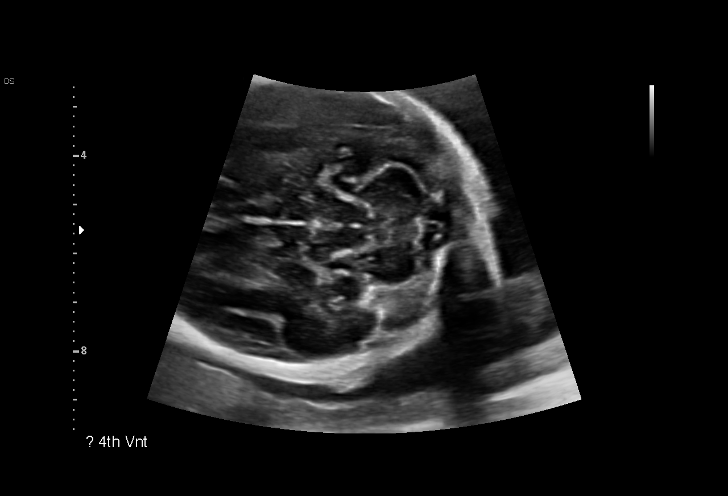
[im 29/96]
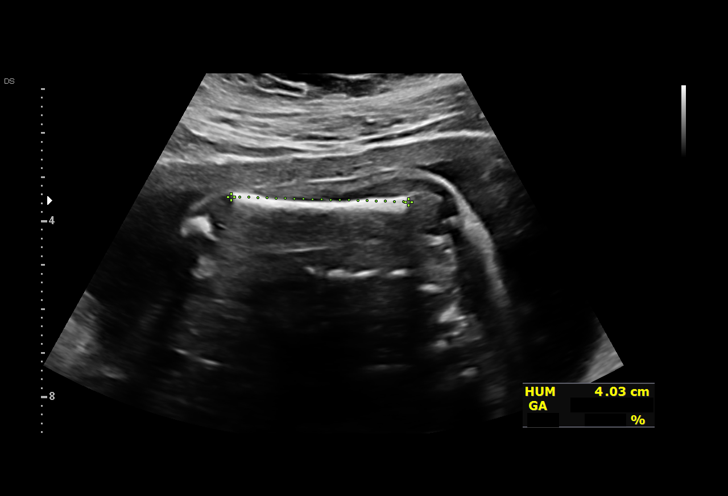
[im 36/96]
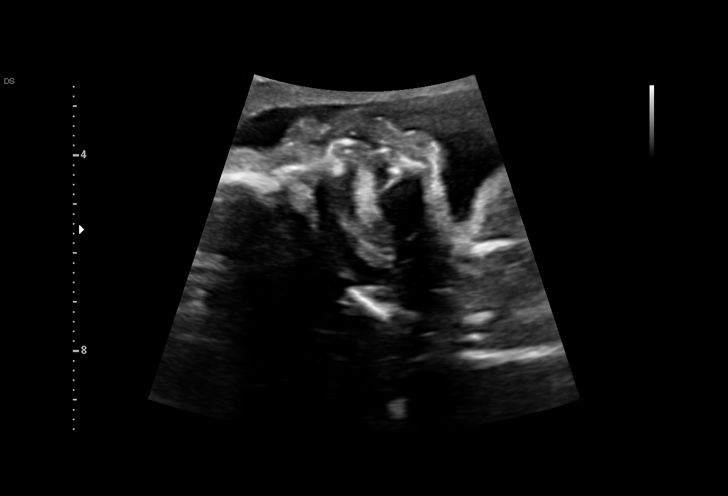
[im 43/96]
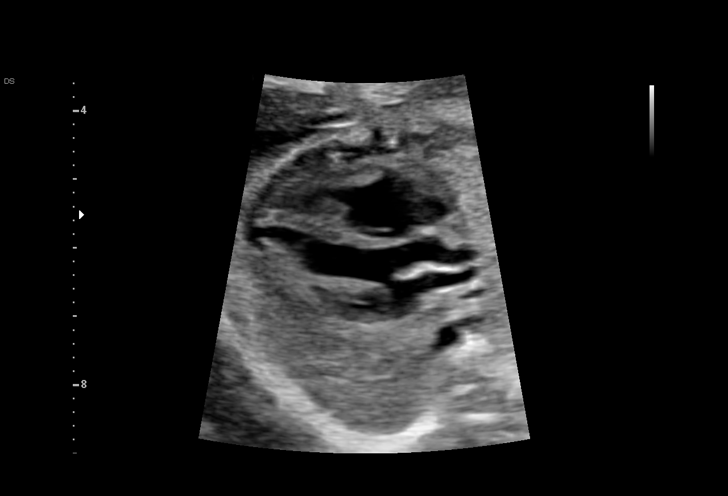
[im 53/96]
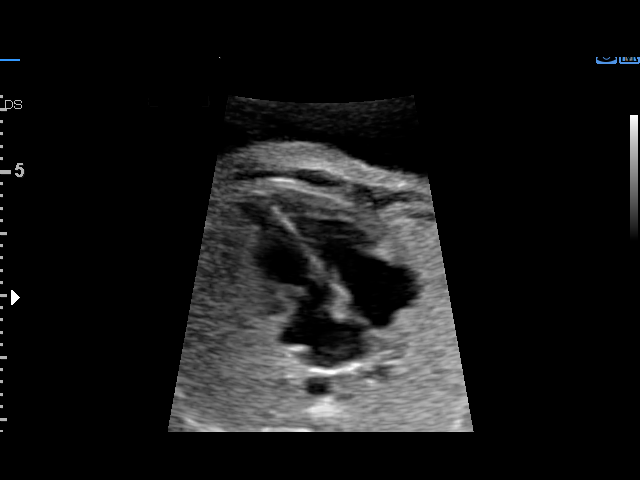
[im 60/96]
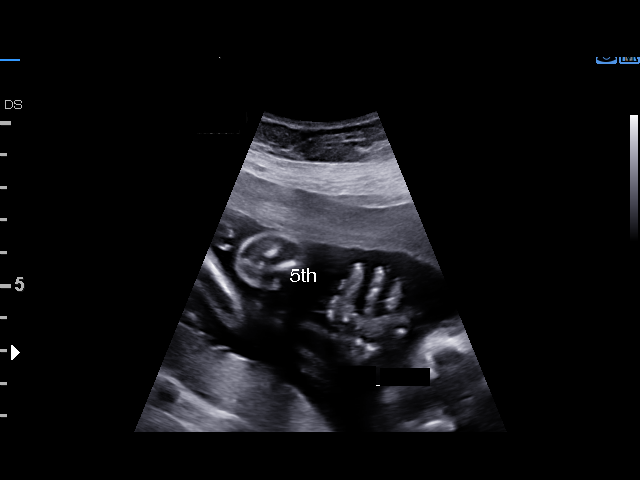
[im 67/96]
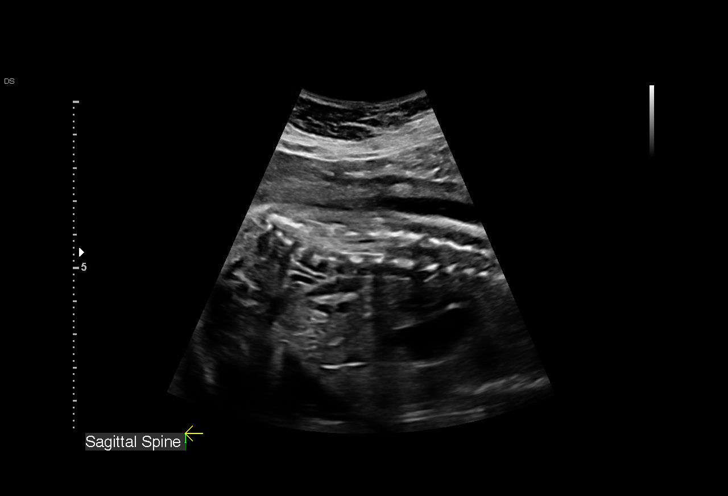
[im 78/96]
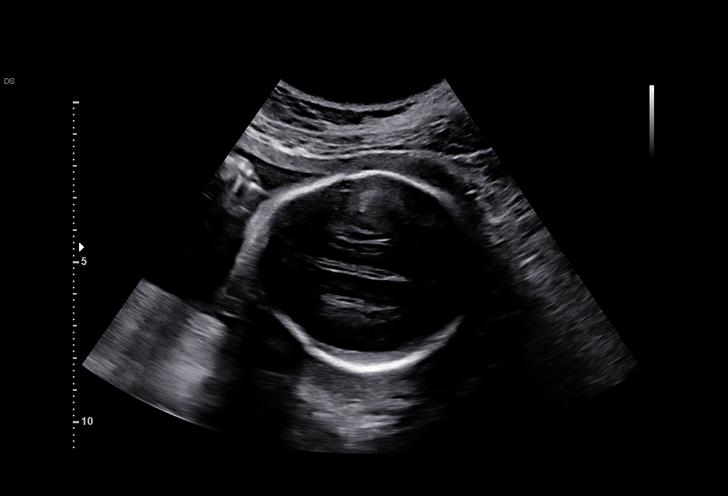
[im 85/96]
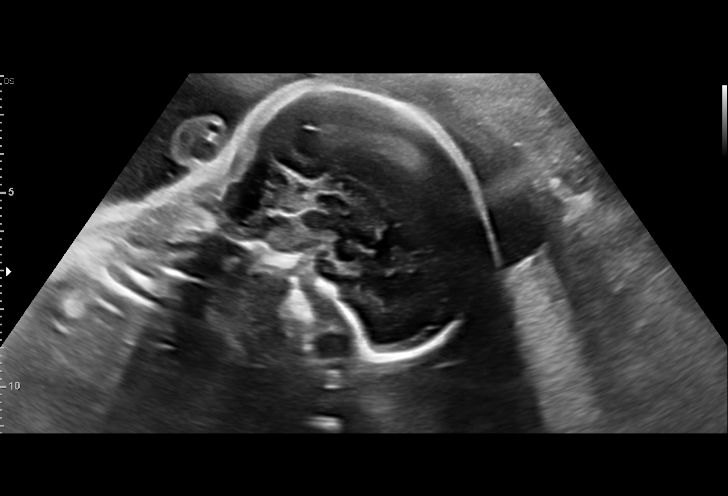
[im 92/96]
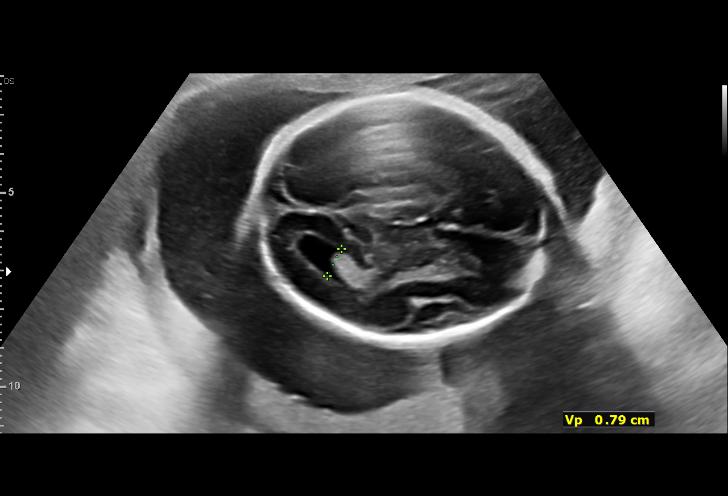

[12 of 28 positions shown; findings below may reference images not displayed]

[facility]

                                                            OBGYN
                                                            [address]
 Referred By:      ARVIC TIM EM

                                                      AVI

Indications

 24 weeks gestation of pregnancy
 Advanced maternal age multigravida 34+,
 second trimester
 Abnormal ultrasound finding on antenatal
 screening of mother
 Encounter for antenatal screening for
 malformations
Fetal Evaluation

 Num Of Fetuses:         1
 Cardiac Activity:       Observed
 Presentation:           Variable
 Placenta:               Posterior Fundal
 P. Cord Insertion:      Visualized, central

                             Largest Pocket(cm)

Biometry

 BPD:        61  mm     G. Age:  24w 6d         51  %    CI:        75.87   %    70 - 86
                                                         FL/HC:      18.9   %    18.7 -
 HC:       222   mm     G. Age:  24w 2d         19  %    HC/AC:      1.06        1.04 -
 AC:      210.1  mm     G. Age:  25w 4d         72  %    FL/BPD:     68.9   %    71 - 87
 FL:         42  mm     G. Age:  23w 5d         14  %    FL/AC:      20.0   %    20 - 24
 HUM:      40.4  mm     G. Age:  24w 4d         42  %
 CER:      25.7  mm     G. Age:  23w 2d         25  %

 LV:        6.1  mm
 CM:        4.4  mm

 Est. FW:     726  gm    1 lb 10 oz      47  %
OB History

 Gravidity:    4         Term:   2         SAB:   1
 Living:       2
Gestational Age

 LMP:           24w 4d        Date:  04/04/20                 EDD:   01/09/21
 U/S Today:     24w 4d                                        EDD:   01/09/21
 Best:          24w 4d     Det. By:  LMP  (04/04/20)          EDD:   01/09/21
Anatomy

 Cranium:               Appears normal         LVOT:                   Appears normal
 Cavum:                 Abnormal, see          Aortic Arch:            Appears normal
                        comments
 Ventricles:            Abnormal, see          Ductal Arch:            Appears normal
                        comments
 Choroid Plexus:        Appears normal         Diaphragm:              Appears normal
 Cerebellum:            Appears normal         Stomach:                Appears normal, left
                                                                       sided
 Posterior Fossa:       Appears normal         Abdomen:                Appears normal
 Nuchal Fold:           Not applicable (>20    Abdominal Wall:         Appears nml (cord
                        wks GA)                                        insert, abd wall)
 Face:                  Appears normal         Cord Vessels:           Appears normal (3
                        (orbits and profile)                           vessel cord)
 Lips:                  Appears normal         Kidneys:                Appear normal
 Palate:                Appears normal         Bladder:                Appears normal
 Thoracic:              Appears normal         Spine:                  Appears normal
 Heart:                 Appears normal         Upper Extremities:      Appears normal
                        (4CH, axis, and
                        situs)
 RVOT:                  Appears normal         Lower Extremities:      Appears normal

 Other:  *Agenesis of the Corpus Callosum*. Fetus appears to be female.
         Nasal bone visualized. Heels/feet and open hands/5th digits
         visualized; however, fingers appear long a thin. VC, 3VV and 3VTV
         visualized.
Cervix Uterus Adnexa

 Cervix
 Length:           3.89  cm.
 Normal appearance by transabdominal scan.

 Right Ovary
 Within normal limits.

 Left Ovary
 Within normal limits.
Impression

 G4 P2. Patient is here for a second-opinion ultrasound. On
 your office, agenesis of corpus callosum was suspected.
 Patient has not had screening for fetal aneuploidies.
 Obstetric history is significant for 2 previous term vaginal
 deliveries.

 We performed fetal anatomy scan. Fetal biometry is
 consistent with her previously established dates. Amniotic
 fluid is normal and good fetal activity is seen. Detailed
 evaluation of intracranial structures (including coronal and
 sagittal views) was performed.
 Significant ultrasound findings include:
 -Absent cavum septum pellucidum.
 -"Tear-drop" shaped ventricles parallel to each other.
 Ventricular measurements are normal.
 -Narrow anterior horns were seen.
 -On sagittal views, corpus callosum is either completely or
 partially absent. I could not clearly identify the corpus
 callosum. Pericallosal artery is absent.
 -The third ventricle is seen and elevated.
 -Rest of the intracranial anatomy including midline, posterior
 fossa appears normal.

 Cardiac anatomy appears normal and no other obvious fetal
 structural defects are seen.
 I counseled the patient on the following:
 -Explained the anatomy of corpus callosum.
 -Ultrasound has limitations in detecting additional intracranial
 structural anomalies. I recommended fetal brain MRI to
 confirm the diagnosis and rule out additional anomalies if
 present.
 -ACC is associated with chromosomal anomalies in 10% to
 15% of cases. Copy-number variants occur in about 5% of
 cases.
 -Extracerebral anomalies including cardiac anomalies can be
 present. Recommend fetal echocardiography.
 -Prognosis is based on the presence or absence of
 associated anomalies. Isolated cases have reasonably good
 outcomes.
 -For detailed counseling, we will refer the patient to pediatric
 neurologist once the diagnosis is established.

 I recommended amniocentesis to rule out genetic causes. I
 explained the procedure and possible complications including
 preterm delivery (1 in 500 procedures).

 After ultrasound, the patient met with our genetic counselor.
 She will discuss with her family and decide on amniocentesis.
 We will arrange for fetal brain MRI and fetal
 echocardiography.
Recommendations

 -We will send referral for fetal brain MRI ([[facility]).
 -We will request appointment for fetal echocardiography.
 -If patient decides to have amniocentesis, we will schedule an
 appointment.
                 Garth, Kuku

## 2020-09-24 ENCOUNTER — Other Ambulatory Visit: Payer: Self-pay

## 2020-09-26 ENCOUNTER — Telehealth: Payer: Self-pay | Admitting: Genetic Counselor

## 2020-09-26 NOTE — Telephone Encounter (Signed)
I called Ms. Randie Heinz with the help of [address] [place], [place] [id number] and [address]ap[address] Calamity, [place] [phone] to check in and see if she and her husband have made any further decisions about testing following our genetic counseling consultation on Monday. She informed me that she wants to pursue fetal echocardiogram and MRI at [facility] and would also like to undergo an amniocentesis. She prefers amniocentesis over NIPS as amniocentesis is diagnostic and can assess for more genetic conditions than NIPS is able to.  We discussed the various types of genetic testing that can be ordered on an amniocentesis sample. She would like to pursue FISH, chromosomal microarray, and a brain malformation multigene panel (if her insurance will cover the cost of this). If insurance will not cover a multigene panel and chromosomal analysis is negative, she informed me that she would be interested in enrolling in [facility]'s whole exome sequencing research study. We can sign a release form for a portion of her amniotic fluid to be sent from [facility] to [facility]. We discussed that results from [facility] take 48-72 hours to be returned and that results from chromosomal microarray can take 9-13 days to be returned. Results from a multigene panel would take several weeks to be returned.  Ms. Randie Heinz was counseled on the types of results that are possible for genetic testing on amniocentesis. The first type of result is positive. This means that a pathogenic variant in a gene or chromosomal region known to be associated with fetal brain anomalies was identified. The second type of result is negative. This means that a genetic change related to fetal brain anomalies was not identified. Ms. Randie Heinz understands that a negative result would not exclude the possibility of a genetic condition in the current fetus. This is because it is possible that the fetus could have a variant in a gene that was not assessed for, or there may be a change in a gene  whose function is unknown. We also reviewed that it is possible that the fetal brain anomaly does not have a genetic cause. The third and final result type is a variant of uncertain significance (VUS). A VUS means that a change was identified in one of the genes or chromosomal regions that was tested; however, it is unclear if this change is benign or pathogenic. Parental testing is often recommended to help determine whether or not a VUS is of clinical significance.  Ms. Randie Heinz will return to MFM for her amniocentesis on Tuesday 11/23. Her nurse appointment is scheduled for 7:30 AM. She understands that she will need to arrive by 7:15 AM. In the meantime, I will try to obtain prior authorization from her insurance company for a brain malformation multigene panel. Ms. Randie Heinz confirmed that she had no further questions at this time.  Gershon Crane, MS, [facility] Genetic Counselor

## 2020-09-27 NOTE — Progress Notes (Signed)
09/27/2020  Chantrell Apsey 11-09-1981 MRN: [id number] DOV: 09/23/2020  Ms. 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 Ms. Elon Jester presented to her appointment alone. This session was facilitated by a [employer] interpreter.   Indication for genetic counseling - Agenesis of the corpus callosum  Prenatal history  Ms. Elon Jester is a [id number], 39 y.o. female. Her current pregnancy has completed [redacted]w[redacted]d(Estimated Date of Delivery: 01/09/21).  Prenatal history was not reviewed in detail due to the nature of today's appointment. However, Ms. LElon Jesterdeclined exposure to teratogens such as alcohol and reported no significant illnesses, fevers, or injury during the course of her pregnancy.  Family History  A three generation pedigree was not drafted and reviewed due to the nature of today's appointment.   Discussion  Agenesis of the corpus callosum:  I met with Ms. LElon Jesterat Dr. SJaquita Rectorrequest to discuss findings from her anatomy ultrasound. A complete ultrasound was performed today prior to our visit. The ultrasound report will be sent under separate cover. 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   We discussed that the corpus callosum is a large bundle of nerves that connect the two brain hemispheres. Agenesis refers to a partially or completely absent corpus callosum. ACC affects approximately 1 in 4000 liveborn infants.   We discussed that there are a wide variety of features an individual with ACC may have. Possible features include epilepsy, hydrocephalus, feeding difficulties, growth delays, distinctive facial features, and micro- or macrocephaly. Individuals with ACC may have developmental delays and/or intellectual disability. The most common developmental challenges  include deficient problem-solving, impaired speech and social skills, and difficulties performing activities of daily living.  Symptoms present in the first two years of life for 90% of individuals with ACC. We reviewed that some individuals may be mildly impacted while others may be more severe. Mild cases may not be diagnosed until seizures, repetitive speech, or headaches are noted. Individuals who are mildly impacted may have only mild learning difficulties or normal intelligence. Individuals who are more severely impacted may have cerebral palsy, severe intellectual disability, autism, and seizures. We discussed that prognosis depends on the presence of other anomalies and the underlying cause of ACC. Prognosis associated with ACC often cannot be fully predicted during the prenatal period.  Ms. LElon Jesterwas counseled that the cause of ACC is most often unknown. There are several syndromes in which ACC can be a feature, including chromosomal aneuploidies like trisomy 13 or single gene disorders that can be inherited in an autosomal recessive or X-linked fashion. Another disorder in which ACC is a hallmark feature is Aicardi syndrome. This is an X-linked disorder that primarily affects females. However, the cause of Aicardi syndrome is unknown. In addition to syndromic causes, ACC can be associated with in utero infections or fetal injury. Maternal alcohol use during pregnancy is a known teratogen associated with ACC.  We discussed that ACC can be seen in fetuses with other anomalies, including spina bifida, other brain anomalies, and congenital heart defects. For this reason, it is recommended that Ms. LElon Jesterundergo a fetal echocardiogram and fetal brain MRI.  Testing options:  We discussed screening/testing options that are available to attempt to determine the underlying etiology for ACC identified on ultrasound today. Firstly, we discussed noninvasive prenatal screening (NIPS) as an  available screening option for chromosomal aneuploidies such as trisomy 13. Specifically, we discussed that NIPS analyzes cell free DNA originating from the placenta that is found in the maternal blood circulation during pregnancy. This test is not diagnostic for chromosome conditions, but can provide information regarding the presence or absence of extra fetal DNA for chromosomes 13, 18 21, and the sex chromosomes. Thus, it would not identify or rule out all fetal aneuploidy. The reported detection rate is 91-99% for trisomies 21, 18, 13, and sex chromosome aneuploidies. The false positive rate is reported to be less than 0.1% for any of these conditions. Since Ms. Elon Jester will be 39 years old at the time of delivery, her chance of having a baby with a chromosomal aneuploidy such as trisomy 50 (Down syndrome), trisomy 38, or trisomy 49 is approximately 1 in 105 (2.9%) in the second trimester. We briefly reviewed features of select chromosomal aneuploidies such as trisomy 27.    Ms. Elon Jester was also counseled regarding diagnostic testing via amniocentesis. We discussed the technical aspects of the procedure and quoted up to a 1 in 500 (0.2%) risk for spontaneous pregnancy loss or other adverse pregnancy outcomes as a result of amniocentesis. Cultured cells from an amniocentesis sample allow for the visualization of a fetal karyotype, which can detect >99% of large chromosomal aberrations such as chromosomal aneuploidies. Chromosomal microarray can also be performed to identify smaller deletions or duplications of fetal chromosomal material. Amniocentesis could also be performed to assess whether the baby is affected by a single gene condition associated with ACC via a multigene sequencing panel. Ms. Elon Jester was informed that amniocentesis is the only way to definitively determine if the fetus has a genetic condition prenatally. However, chromosomal testing and multigene sequencing cannot assess for  all possible genetic conditions associated with ACC. Additionally, she understands that it is possible that the fetus's ACC may not be due to a genetic change.  Plan:  Ms. Elon Jester was unsure of which, if any, screening/testing options she wanted to pursue. She is confident that she would like to proceed with scheduling the fetal MRI and echocardiogram. However, she wished to discuss her other options with her husband prior to making a decision. She informed me that she would not consider termination of pregnancy regardless of whether or not the current fetus has a genetic condition. However, she may be interested in further testing to try to gain additional information that could potentially aid in predicting prognosis. We made a plan for me to check in on Ms. Elon Jester later this week. I will call her after she has had time to discuss her options with her partner and can help to facilitate testing if desired from there. She was agreeable to this plan.  I provided Ms. Elon Jester with a resource on [facility] from the [employer] [facility] ([facility]) at her request. I counseled Ms. Elon Jester regarding the above risks and available options. The approximate face-to-face time with the genetic counselor was 50 minutes.  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 We will facilitate referrals  Discussed agenesis of the corpus callosum, including information about possible causes and prognosis  Chromosomal abnormalities, other genetic syndromes, or multifactorial causes all possible  Prognosis depends on underlying cause and presence of other abnormalities  Prognosis may not be able to be determined prenatally  Offered  additional testing and screening  Considering NIPS vs amniocentesis. I will contact patient later this week to determine if  she would like to pursue testing and help to facilitate sample collection if desired   Buelah Manis, MS, [facility]

## 2020-10-01 ENCOUNTER — Telehealth: Payer: Self-pay | Admitting: Genetic Counselor

## 2020-10-02 NOTE — Telephone Encounter (Signed)
Received a phone call from Ms. Randie Heinz informing me that she needed insurance prior authorization for her fetal echocardiogram, fetal MRI, and amniocentesis. I informed Ms. Randie Heinz that we had already gotten prior authorization for her fetal MRI and would work on obtaining it for her echocardiogram and amniocentesis the next day (11/17).  On 11/17, I called Ms. Randie Heinz back with the assistance of a [employer],  [id number]. I informed Ms. Randie Heinz that prior authorization was not required for fetal echocardiogram or amniocentesis. However, we were directed to [employer] coverage policies for the CPT codes associated with FISH, karyotype, and chromosomal microarray analysis which indicated that insurance would likely not cover FISH or chromosomal microarray. I also informed Ms. Randie Heinz that even though prior authorization was obtained/not required for the fetal MRI and fetal echocardiogram, this does not guarantee 100% coverage of these procedures. Ms. Randie Heinz expressed her understanding of this caveat. She is still interested in these studies in order to better understand the anomalies identified in her fetus.   I presented Ms. Randie Heinz with several options moving forward, including prenatal NIPS with postnatal genetic testing and pursuing amniocentesis. After a review of the benefits and limitations of both options, Ms. Randie Heinz elected to still return to MFM next week for an amniocentesis. She would like to pursue karyotype and is interested in enrolling in [facility]'s whole exome sequencing study should karyotype analysis be normal. I have since emailed [facility]'s study coordinator, genetic counselor Beacher May, to confirm that chromosomal microarray analysis is not required in order for an individual to be eligible for the research study. I told Ms. Randie Heinz that I will call her prior to her amniocentesis Tuesday if it is determined that she  wouldn't be eligible for research without chromosomal microarray analysis.  Ms. Randie Heinz confirmed that she had no further questions at this time. I encouraged her to contact me if she has any additional questions prior to her amniocentesis next Tuesday.  Gershon Crane, MS, [facility] Genetic Counselor

## 2020-10-07 ENCOUNTER — Telehealth: Payer: Self-pay | Admitting: Genetic Counselor

## 2020-10-07 NOTE — Telephone Encounter (Signed)
I called Ms. Jacqueline Gray with the help of Jacqueline Gray, Jacqueline Gray to give her an update about tomorrow's amniocentesis. I spoke with Jacqueline Gray, genetic counselor at [facility] who is involved in their whole exome sequencing ([facility]) research study. Jacqueline Gray informed me that usually they require both a negative karyotype and negative chromosomal microarray to be eligible for the [facility] research study. Given that Ms. Jacqueline Gray will not cover the cost of chromosomal microarray, Jacqueline Gray indicated that she and the research coordinator Gray be willing to make an exception. However, they wished to review the fetal MRI before making this decision. Since Ms. Jacqueline Gray fetal MRI report is still under review, we will not having this answer prior to her scheduled amniocentesis tomorrow morning. Ms. Jacqueline Gray expressed her understanding and confirmed that she would still like to have her amniocentesis. We will order karyotype analysis to rule out aneuploidy or large chromosomal aberrations/rearrangements. Should [facility] allow Ms. Jacqueline Gray to participate in their study, we will transfer a portion of the sample over to them through [facility]. Ms. Jacqueline Gray was agreeable with this plan and confirmed that she had no further questions at this time.  Jacqueline Crane, MS, [facility] Genetic Counselor

## 2020-10-08 ENCOUNTER — Other Ambulatory Visit: Payer: Self-pay | Admitting: *Deleted

## 2020-10-08 ENCOUNTER — Ambulatory Visit: Payer: BC Managed Care – PPO

## 2020-10-08 ENCOUNTER — Ambulatory Visit: Payer: BC Managed Care – PPO | Attending: Obstetrics and Gynecology

## 2020-10-08 ENCOUNTER — Encounter: Payer: Self-pay | Admitting: *Deleted

## 2020-10-08 ENCOUNTER — Ambulatory Visit: Payer: BC Managed Care – PPO | Admitting: *Deleted

## 2020-10-08 ENCOUNTER — Other Ambulatory Visit: Payer: Self-pay

## 2020-10-08 ENCOUNTER — Other Ambulatory Visit: Payer: Self-pay | Admitting: Obstetrics and Gynecology

## 2020-10-08 VITALS — BP 135/60 | HR 86

## 2020-10-08 DIAGNOSIS — O350XX0 Maternal care for (suspected) central nervous system malformation in fetus, not applicable or unspecified: Secondary | ICD-10-CM | POA: Diagnosis present

## 2020-10-08 DIAGNOSIS — Z36 Encounter for antenatal screening for chromosomal anomalies: Secondary | ICD-10-CM | POA: Insufficient documentation

## 2020-10-08 DIAGNOSIS — O3501X0 Maternal care for (suspected) central nervous system malformation or damage in fetus, agenesis of the corpus callosum, not applicable or unspecified: Secondary | ICD-10-CM

## 2020-10-08 DIAGNOSIS — O283 Abnormal ultrasonic finding on antenatal screening of mother: Secondary | ICD-10-CM | POA: Diagnosis not present

## 2020-10-08 DIAGNOSIS — O09522 Supervision of elderly multigravida, second trimester: Secondary | ICD-10-CM

## 2020-10-08 DIAGNOSIS — Z3A26 26 weeks gestation of pregnancy: Secondary | ICD-10-CM

## 2020-10-08 DIAGNOSIS — Q040 Congenital malformations of corpus callosum: Secondary | ICD-10-CM

## 2020-10-08 IMAGING — US US MFM AMNIOCENTESIS
1 series · 14 of 14 positions shown · non-contrast
Comparison: none

[Series 1: us mfm amniocentesis · 14 acquisitions, 14 frames shown]
[im 1/14]
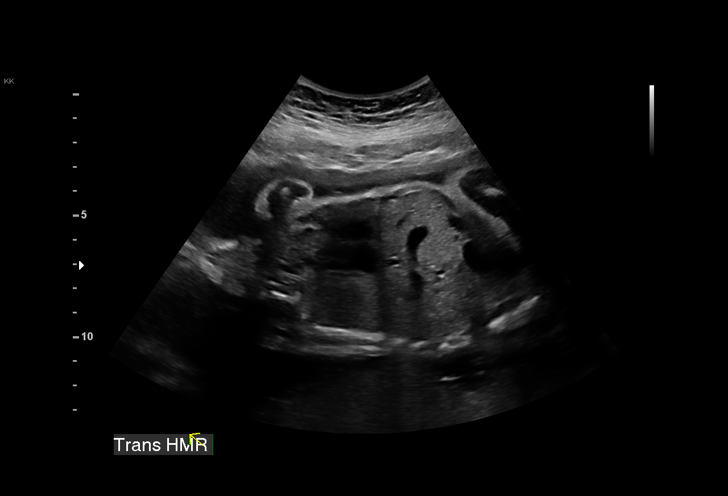
[im 2/14]
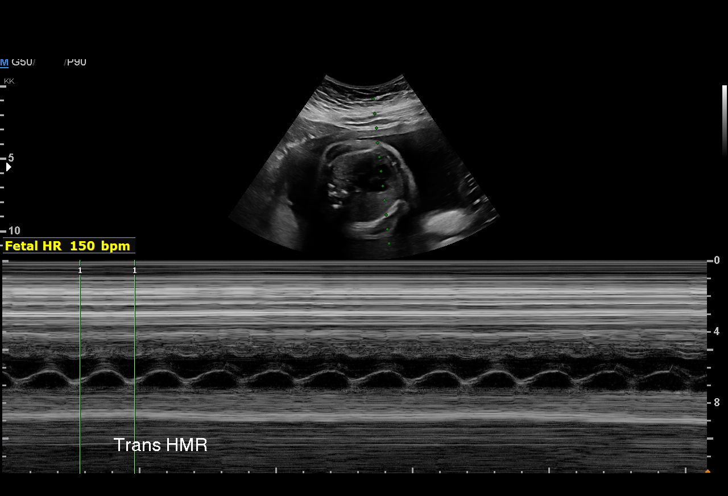
[im 3/14]
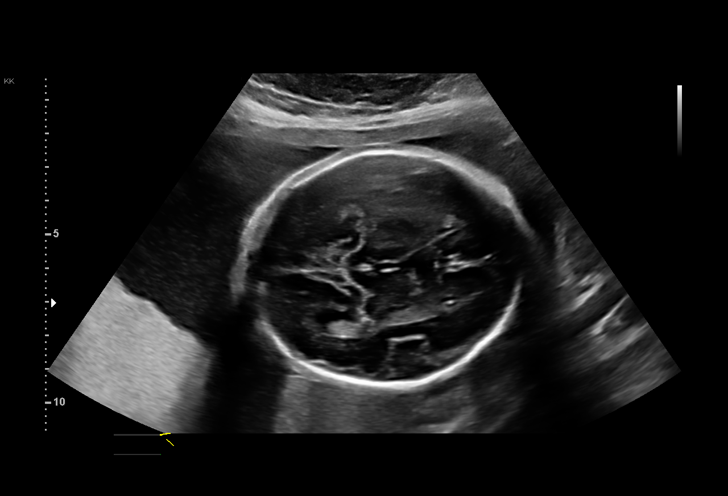
[im 4/14]
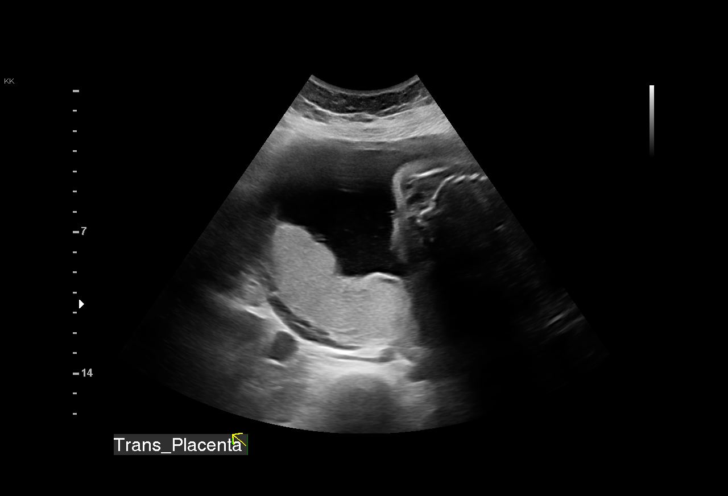
[im 5/14]
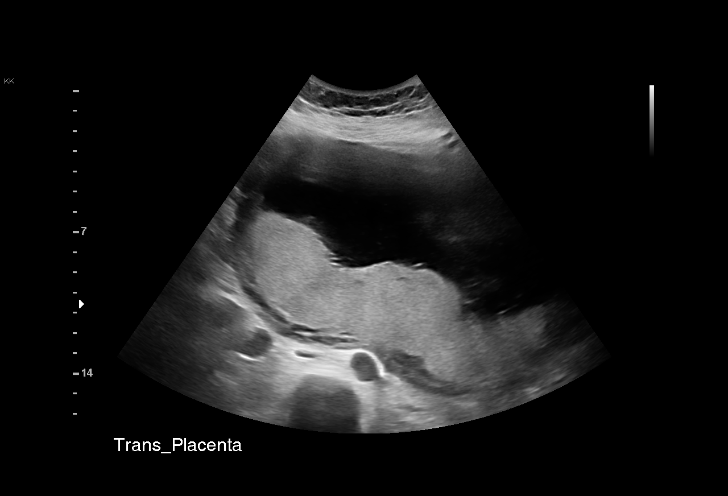
[im 6/14]
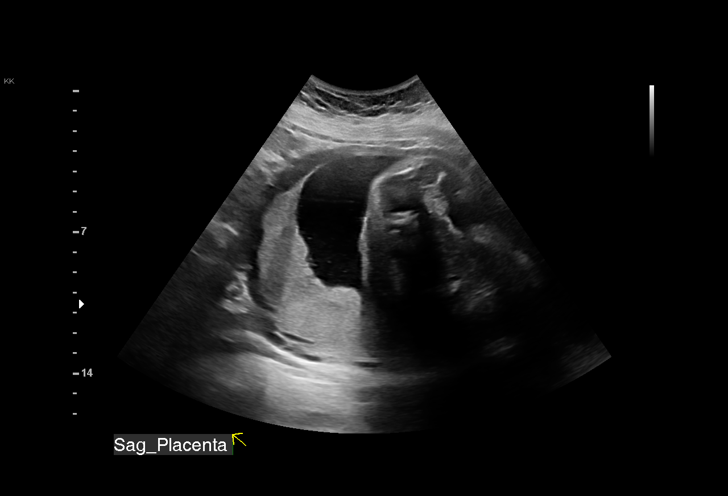
[im 7/14]
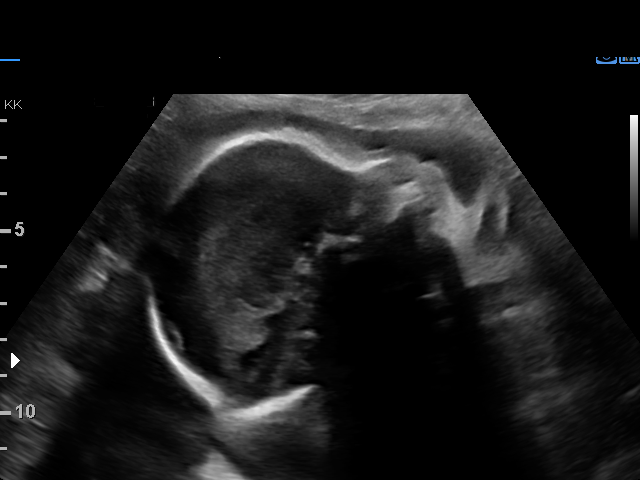
[im 8/14]
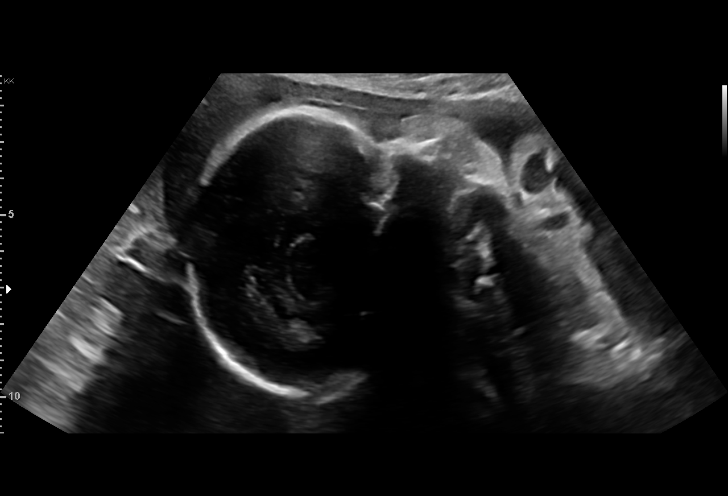
[im 9/14]
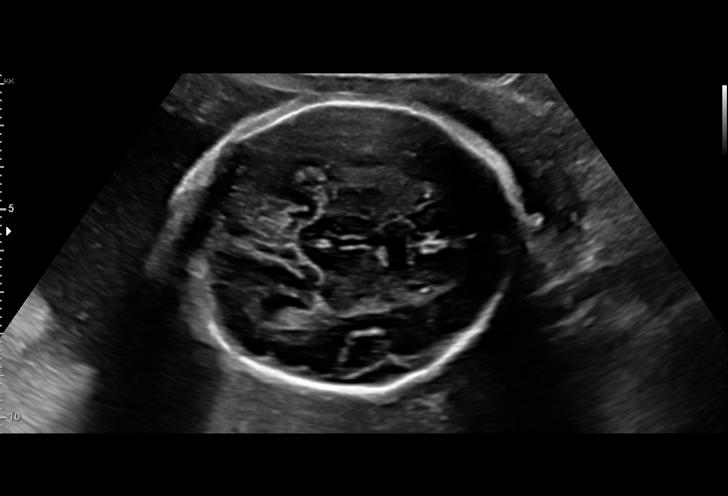
[im 10/14]
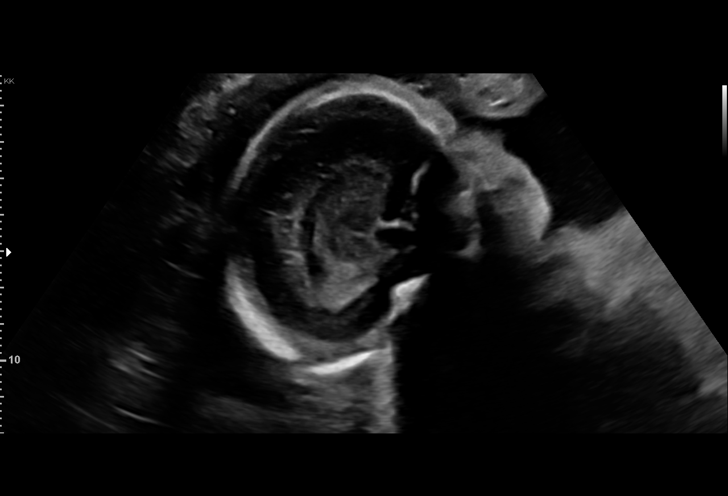
[im 11/14]
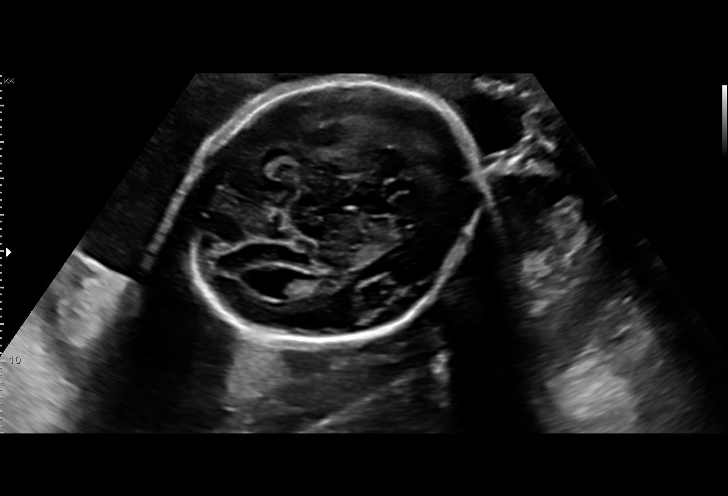
[im 12/14]
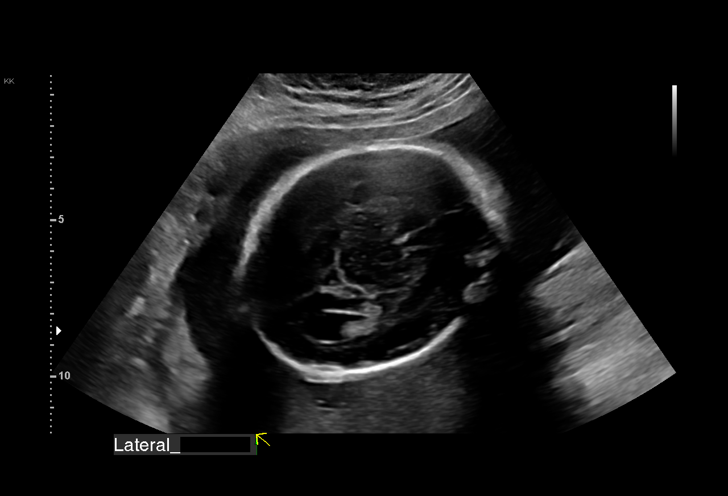
[im 13/14]
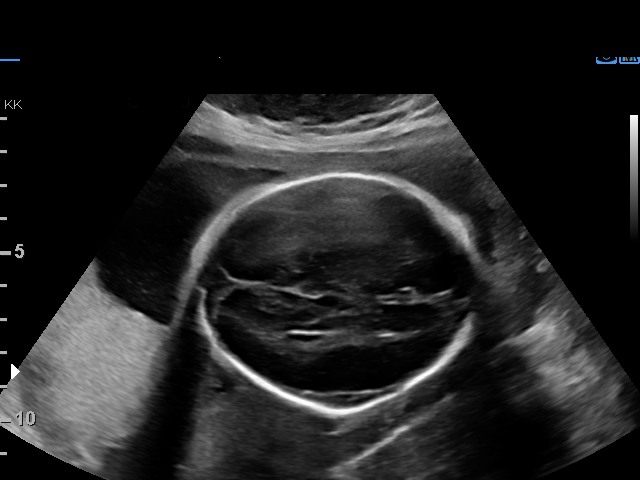
[im 14/14]
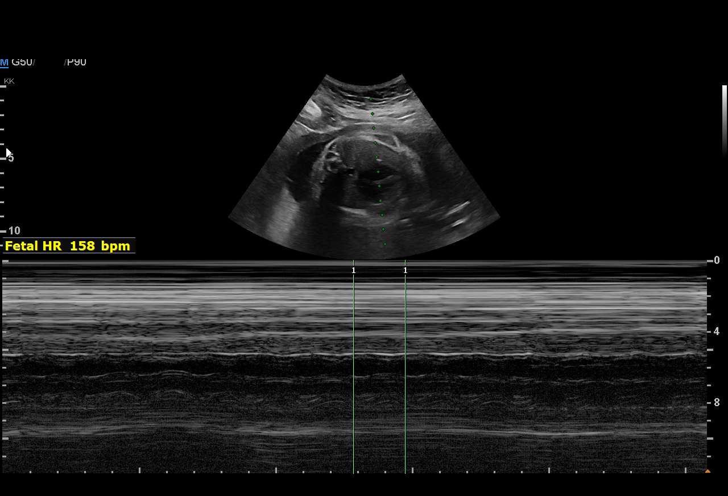

[14 of 14 positions shown; findings below may reference images not displayed]

[facility]

                                                            OBGYN
                                                            [address]
 Referred By:      ASAKE ADANMA

Indications

 Advanced maternal age multigravida 35+,
 second trimester
 Abnormal ultrasound finding on antenatal
 screening of mother
 26 weeks gestation of pregnancy
Fetal Evaluation

 Num Of Fetuses:         1
 Fetal Heart Rate(bpm):  150
 Cardiac Activity:       Observed
 Presentation:           Transverse, head to maternal right
 Placenta:               Posterior Fundal
 P. Cord Insertion:      Previously Visualized

 Amniotic Fluid
 AFI FV:      Within normal limits
OB History

 Gravidity:    4         Term:   2         SAB:   1
 Living:       2
Gestational Age
 LMP:           26w 5d        Date:  04/04/20                 EDD:   01/09/21
 Best:          26w 5d     Det. By:  LMP  (04/04/20)          EDD:   01/09/21
Guided Procedures

 Type:   Amniocentesis
 FH Pre Procedure:       150 bpm
 FH Post Procedure:      158 bpm
 Rh Type:                A positive
 Rh Immunoglobulin:      Not required, Rh positive
 Discharge Instructions: Post-procedure instructions given
 Needle Insertions:      20 gauge x 1
 Vol. Withdrawn:         34 ml of clear amniotic fluid
 Catheter Passes:        1 pass
 Transabdominal:         Yes
 Complications:  None
 Comment:        Informed consent was obtained. A "time-out" was
                 performed before the procedure. Patient tolerated the
                 procedure well. We gave her post-procedure instructions.
Impression

 Fetal agenesis of corpus callosum confirmed on MRI. Patient
 returned for amniocentesis. She met with our genetic
 counselor today.
 A limited ultrasound study was performed. CSP is not seen.
 Amniotic fluid is normal and good fetal activity is seen.
 I explained amniocentesis procedure and possible
 complication of preterm delivery (1 in 500 procedures).
 Patient opted to have amniocentesis.
 After informed consent, amniocentesis was performed by Dr.
 Atshipara under ultrasound guidance and 34 milliliters of clear
 amniotic fluid was withdrawn. Initial 2 milliliters of fluid were
 discarded. Fluid was sent for fetal karyotype and microarray
 analysis. Patient tolerated the procedure well. Post-procedure
 fetal heart rate was normal.
 NST is reactive for this gestational age.
 We gave her post-procedure instructions.
 Blood type: A positive.
Recommendations

 -An appointment was made for her to return in 2 weeks for
 fetal growth assessment and transvaginal assessment of
 intracranial anatomy (if presentation is cephalic).
                 Purdari, Qeribli

## 2020-10-08 NOTE — Procedures (Signed)
Jacqueline Gray 1981-01-05 [redacted]w[redacted]d  Fetus A Non-Stress Test Interpretation for 10/08/20  Indication: S/P Amniocentesis  Fetal Heart Rate A Mode: External Baseline Rate (A): 150 bpm Variability: Moderate Accelerations: 10 x 10 Decelerations: None Multiple birth?: No  Uterine Activity Mode: Palpation, Toco Contraction Frequency (min): None Resting Tone Palpated: Relaxed Resting Time: Adequate  Interpretation (Fetal Testing) Nonstress Test Interpretation: Reactive Overall Impression: Reassuring for gestational age Comments: Dr. Judeth Cornfield reviewed tracing.

## 2020-10-09 ENCOUNTER — Other Ambulatory Visit: Payer: Self-pay

## 2020-10-09 NOTE — Progress Notes (Unsigned)
10-09-20 Post amnio call today.  Pt states she is feeling good.  Good fetal movement, no LOF, or vaginal bleeding.  All questions answered.  Will return call with results.  Pt verbalized understanding.

## 2020-10-21 LAB — MCC TRACKING

## 2020-10-22 ENCOUNTER — Telehealth: Payer: Self-pay | Admitting: Genetic Counselor

## 2020-10-22 ENCOUNTER — Other Ambulatory Visit: Payer: Self-pay | Admitting: *Deleted

## 2020-10-22 ENCOUNTER — Ambulatory Visit: Payer: BC Managed Care – PPO | Attending: Obstetrics and Gynecology

## 2020-10-22 ENCOUNTER — Other Ambulatory Visit: Payer: Self-pay

## 2020-10-22 ENCOUNTER — Ambulatory Visit: Payer: BC Managed Care – PPO | Admitting: *Deleted

## 2020-10-22 ENCOUNTER — Encounter: Payer: Self-pay | Admitting: *Deleted

## 2020-10-22 VITALS — BP 120/56 | HR 82

## 2020-10-22 DIAGNOSIS — Z362 Encounter for other antenatal screening follow-up: Secondary | ICD-10-CM

## 2020-10-22 DIAGNOSIS — Z3A28 28 weeks gestation of pregnancy: Secondary | ICD-10-CM

## 2020-10-22 DIAGNOSIS — O09523 Supervision of elderly multigravida, third trimester: Secondary | ICD-10-CM | POA: Diagnosis present

## 2020-10-22 DIAGNOSIS — O283 Abnormal ultrasonic finding on antenatal screening of mother: Secondary | ICD-10-CM | POA: Diagnosis not present

## 2020-10-22 DIAGNOSIS — Q040 Congenital malformations of corpus callosum: Secondary | ICD-10-CM | POA: Insufficient documentation

## 2020-10-22 DIAGNOSIS — R9389 Abnormal findings on diagnostic imaging of other specified body structures: Secondary | ICD-10-CM

## 2020-10-22 IMAGING — US US MFM OB FOLLOW-UP
1 series · 13 of 28 positions shown · non-contrast
Comparison: none

[Series 1: us mfm ob follow-up · 58 acquisitions, 13 frames shown]
[im 3/58]
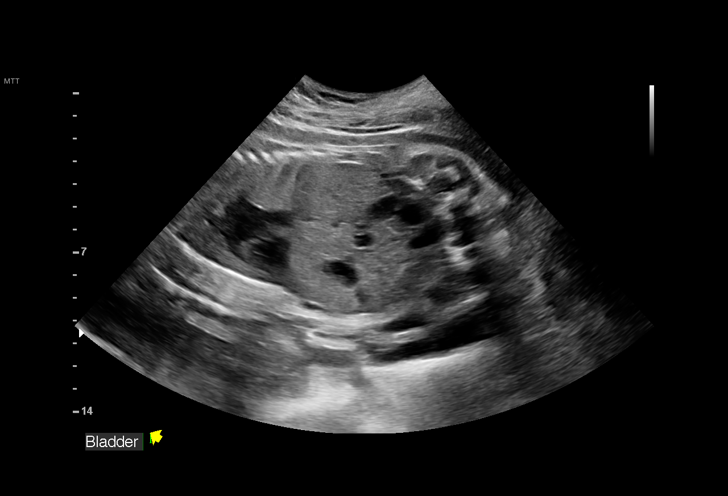
[im 7/58]
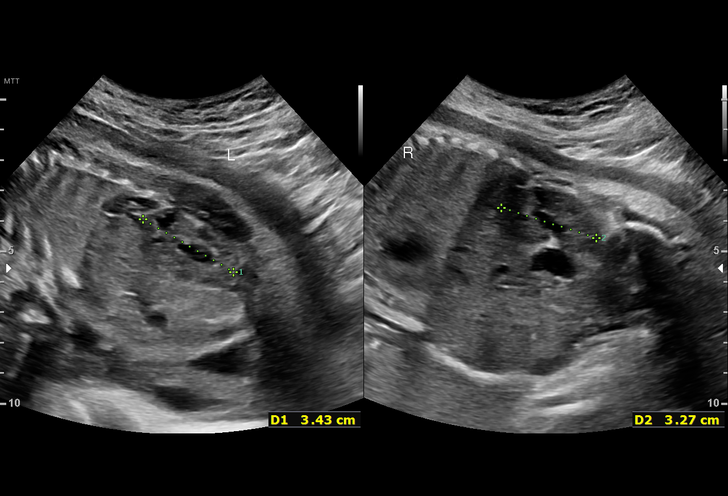
[im 11/58]
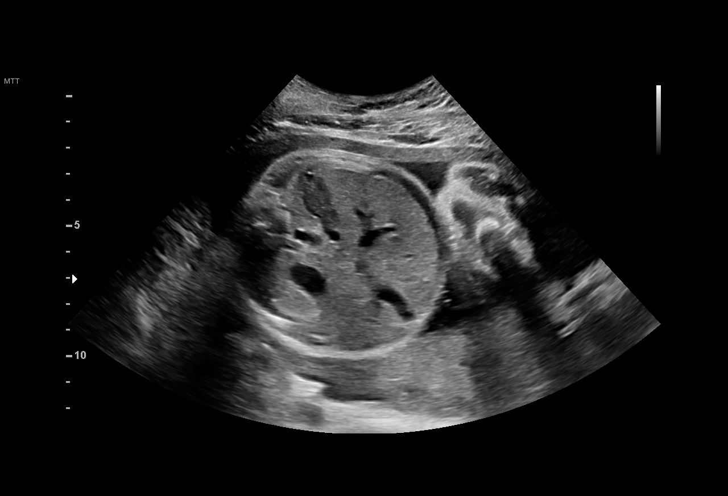
[im 15/58]
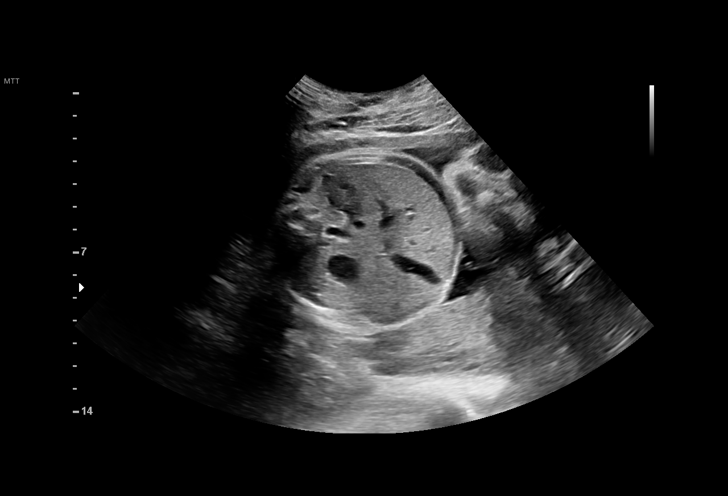
[im 20/58]
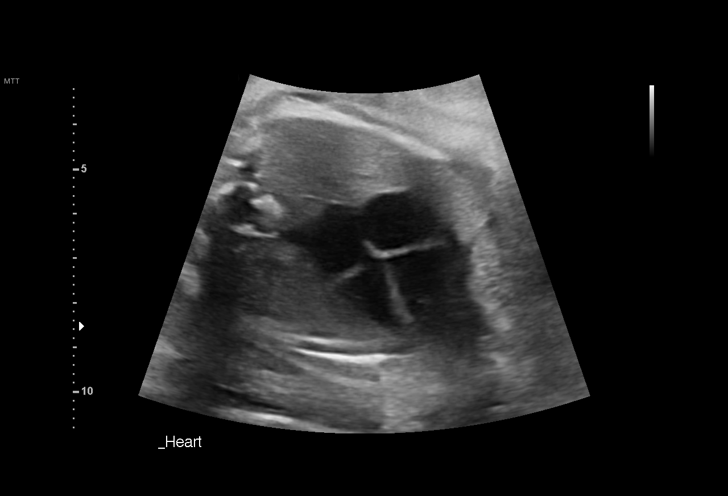
[im 24/58]
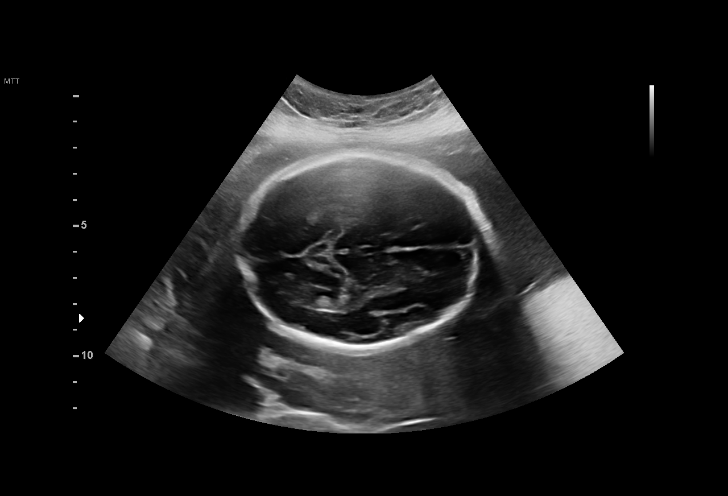
[im 30/58]
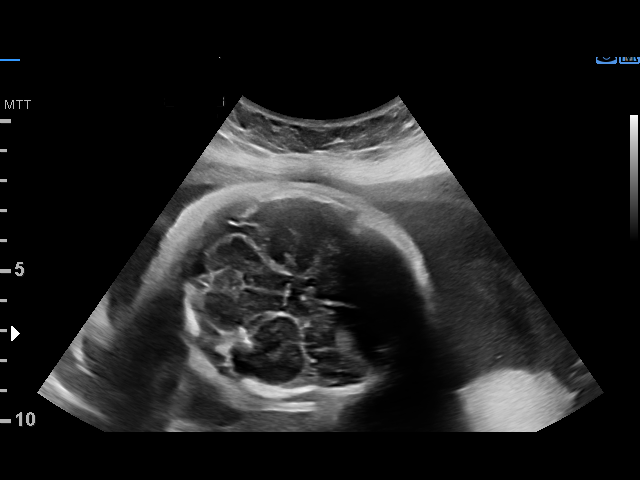
[im 34/58]
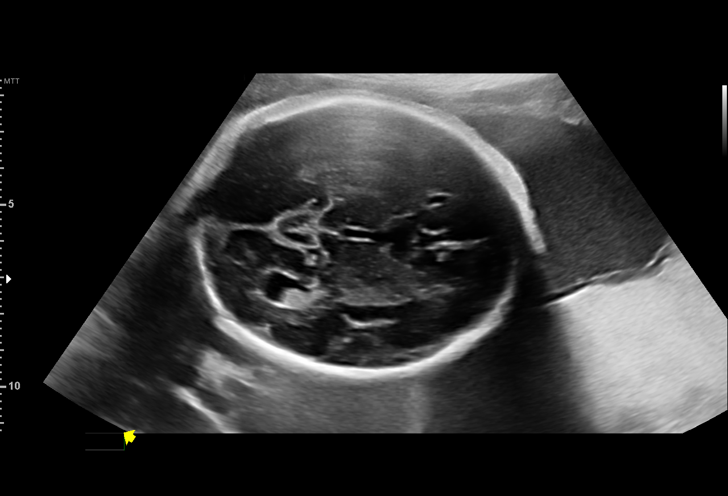
[im 39/58]
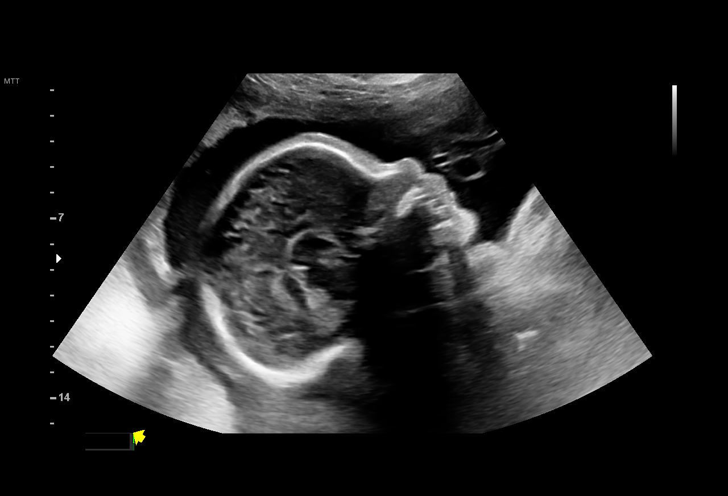
[im 43/58]
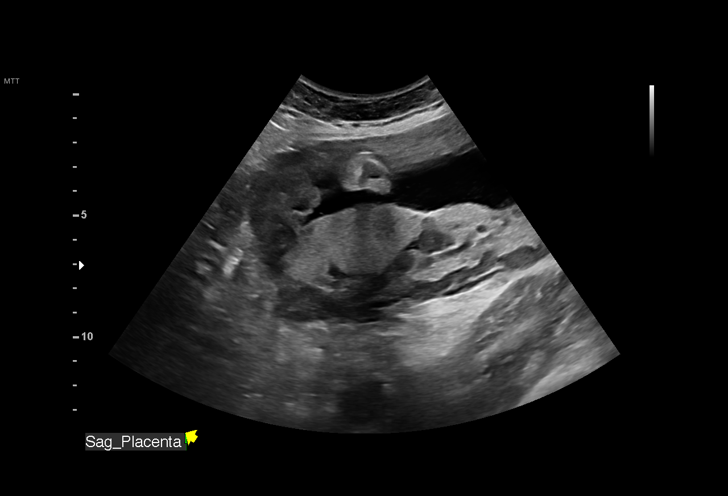
[im 47/58]
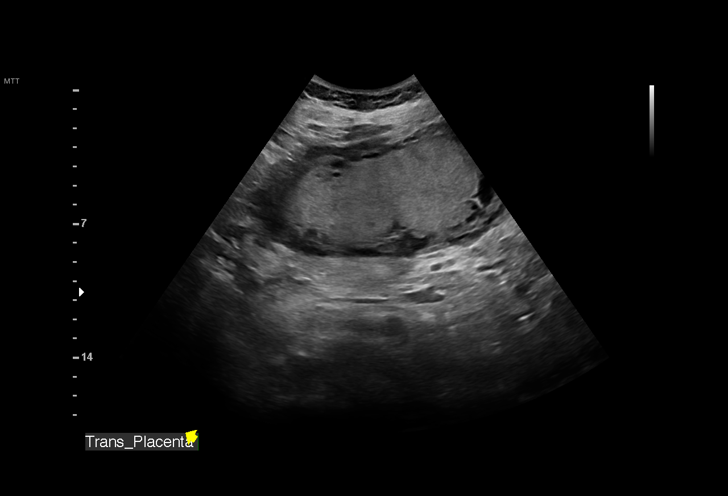
[im 51/58]
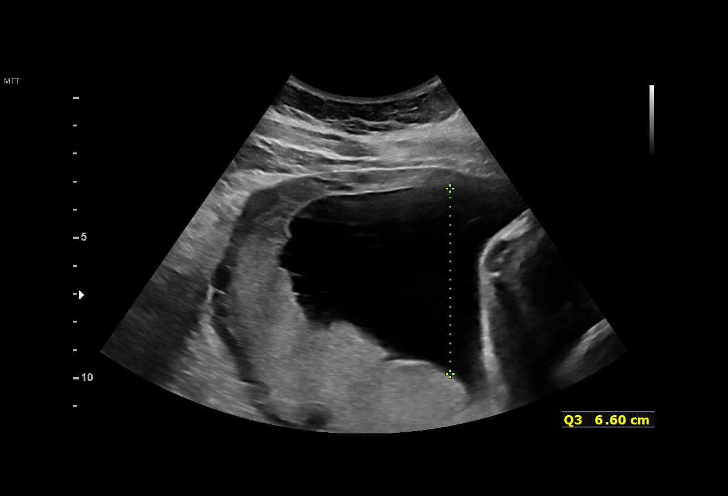
[im 55/58]
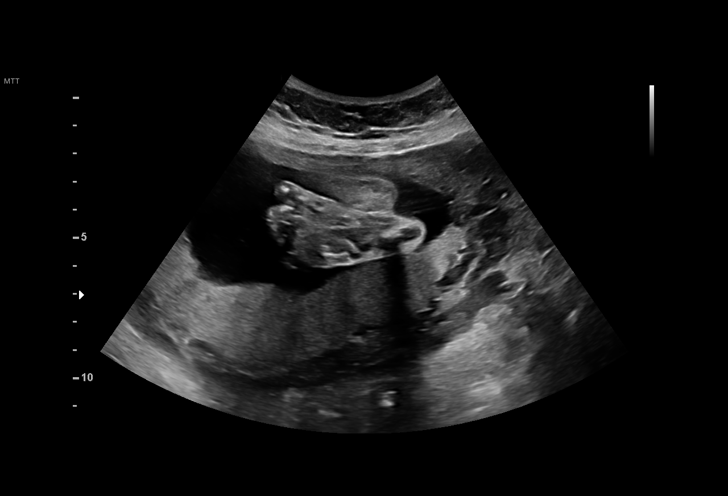

[13 of 28 positions shown; findings below may reference images not displayed]

[facility]

                                                            OBGYN
                                                            [address]
 Referred By:      LANDRIT ALIESCH

Indications

 28 weeks gestation of pregnancy
 Advanced maternal age multigravida 35+,
 second trimester
 Abnormal ultrasound finding on antenatal
 screening of mother
 Encounter for other antenatal screening
 follow-up
Fetal Evaluation

 Num Of Fetuses:         1
 Fetal Heart Rate(bpm):  141
 Cardiac Activity:       Observed
 Presentation:           Breech
 Placenta:               Posterior Fundal
 P. Cord Insertion:      Previously Visualized

 Amniotic Fluid
 AFI FV:      Within normal limits

 AFI Sum(cm)     %Tile       Largest Pocket(cm)
 17.6            66

 RUQ(cm)       RLQ(cm)       LUQ(cm)        LLQ(cm)

Biometry

 BPD:      73.7  mm     G. Age:  29w 4d         66  %    CI:        76.94   %    70 - 86
                                                         FL/HC:      19.7   %    19.6 -
 HC:      266.1  mm     G. Age:  29w 0d         25  %    HC/AC:      1.00        0.99 -
 AC:       265   mm     G. Age:  30w 4d         91  %    FL/BPD:     71.2   %    71 - 87
 FL:       52.5  mm     G. Age:  28w 0d         16  %    FL/AC:      19.8   %    20 - 24
 CER:      34.3  mm     G. Age:  29w 0d         60  %
 LV:        7.5  mm
 CM:        5.4  mm
 Est. FW:    7177  gm      3 lb 2 oz     68  %
OB History

 Gravidity:    4         Term:   2         SAB:   1
 Living:       2
Gestational Age

 LMP:           28w 5d        Date:  04/04/20                 EDD:   01/09/21
 U/S Today:     29w 2d                                        EDD:   01/05/21
 Best:          28w 5d     Det. By:  LMP  (04/04/20)          EDD:   01/09/21
Anatomy

 Cranium:               Previously seen        LVOT:                   Previously seen
 Cavum:                 Abnormal, see          Aortic Arch:            Previously seen
                        comments
 Ventricles:            Abnormal, see          Ductal Arch:            Previously seen
                        comments
 Choroid Plexus:        Previously seen        Diaphragm:              Appears normal
 Cerebellum:            Appears normal         Stomach:                Appears normal, left
                                                                       sided
 Posterior Fossa:       Previously seen        Abdomen:                Previously seen
 Nuchal Fold:           Not applicable (>20    Abdominal Wall:         Previously seen
                        wks GA)
 Face:                  Orbits and profile     Cord Vessels:           Previously seen
                        previously seen
 Lips:                  Previously seen        Kidneys:                Appear normal
 Palate:                Previously seen        Bladder:                Appears normal
 Thoracic:              Previously seen        Spine:                  Previously seen
 Heart:                 Appears normal         Upper Extremities:      Previously seen
                        (4CH, axis, and
                        situs)
 RVOT:                  Previously seen        Lower Extremities:      Previously seen

 Other:  *Agenesis of the Corpus Callosum*. Fetus appears to be female.
         Nasal bone, Heels/feet, open hands/5th digits visualized; however,
         fingers appear long a thin. VC, 3VV and 3VTV visualized.
Cervix Uterus Adnexa

 Cervix
 Not visualized (advanced GA >79wks)
 Uterus
 No abnormality visualized.

 Right Ovary
 Within normal limits.

 Left Ovary
 Within normal limits.

 Cul De Sac
 No free fluid seen.

 Adnexa
 No abnormality visualized.
Impression

 Agenesis of corpus callosum. Confirmed on fetal brain MRI.
 Amniocentesis showed normal fetal karyotype (46, XX).

 Fetal growth is appropriate for gestational age. Amniotic fluid
 is normal and good fetal activity seen. Breech presentation.
 Lateral ventricular measurements are within normal range.
 Posterior fossa appears normal. CSP is not seen.

 We reassured the patient of normal fetal growth.
Recommendations

 -An appointment was made for her to return in 4 weeks for
 fetal growth assessment and transvaginal evaluation of fetal
 head if presentation is cephalic.
                 Mcgowan, Sangita

## 2020-10-22 NOTE — Telephone Encounter (Addendum)
I received a call back from Ms. Jacqueline Gray and her husband to review her results together in detail. This phone call was facilitated by [address] [place],  [id number]. Ms. Jacqueline Gray underwent amniocentesis on 10/08/20 for the finding of fetal agenesis of the corpus callosum (ACC) on ultrasound. Her amniocentesis sample was sent to [facility] for chromosomal analysis via karyotype. We reviewed that the fetal karyotype results were normal, revealing a normal female complement (46,XX). This significantly reduces the possibility of a chromosomal aneuploidy such as Down syndrome, trisomy 65, or trisomy 29 in this fetus, as amniocentesis is able to diagnose chromosome aneuploidies with a sensitivity of >99%.   We reviewed that while a normal karyotype is reassuring, it cannot rule out all possible genetic conditions in the fetus. Karyotype is able to detect aneuploidies, chromosomal rearrangements, and large deletions or duplications of chromosomal material, but smaller chromosomal changes may go undetected. Additionally, a negative karyotype cannot rule out the possibility of a single gene disorder that could explain the fetus's ACC. We reviewed that unfortunately, Ms. Jacqueline Gray would not cover additional genetic testing on the amniocentesis sample such as a chromosomal microarray or a brain malformation multigene panel. However, postnatal genetic testing remains an option should the family be interested in pursuing this. Additionally, I reminded the couple that the cause of ACC is unknown is many cases, even when extensive genetic testing is pursued.  Ms. Jacqueline Gray and her husband had several great questions today. They inquired about expectations for the rest of the pregnancy and after birth. We reviewed that Ms. Jacqueline Gray will continue having ultrasounds here in [facility]. She was reminded that she should continue to receive her routine prenatal care  with her primary obstetrician. She is planning to deliver at [facility]. Per the couple, Dr. Judeth Cornfield offered them a prenatal consultation with neonatology. After reviewing what this consultation may entail, the couple decided that they would like to have this. They are eager to become as prepared as possible before the baby is born. We will facilitate this referral. I also informed the couple that we would be discussing their baby as a part of our monthly Multidisciplinary Antenatal Advisory Committee [facility]) meetings to ensure a smooth transition between their pre- and postnatal care.  Ms. Jacqueline Gray husband also inquired about the timing of delivery, specifically if preterm delivery is possible with ACC. We discussed that while preterm delivery can happen in any pregnancy, there is not a clear association between [facility] and preterm delivery. Ms. Jacqueline Gray will continue to be seen by [place] for ultrasounds and will be informed if recommendations for delivery change.   Finally, Ms. Jacqueline Gray asked if she can have her baby see the same pediatrician as her other two children, as she would like to inform her about the fetus's history prior to birth. I encouraged her to continue her care with the same pediatrician and commended her for being proactive in her child's care. Her children see Dr. Rodney Booze Deal at [facility] in [place]. I will fax Ms. Jacqueline Gray prenatal records to Dr. Roselyn Bering at her request.  The couple confirmed that they had no further questions for me at this time. I encouraged them to contact me should any further questions/concerns arise at any time.   Gershon Crane, MS, [facility] Genetic Counselor

## 2020-10-22 NOTE — Telephone Encounter (Signed)
LVM for Ms. Randie Heinz with the help of [employer], ID# [phone], informing her that I was calling with good news about her test results from amniocentesis. Request a call back to my direct line to discuss these in detail.  Gershon Crane, MS, [facility] Genetic Counselor

## 2020-10-31 LAB — MCC TRACKING

## 2020-11-01 LAB — MATERNAL CELL CONTAMINATION

## 2020-11-01 LAB — CHROMOSOME, AMNIOTIC FLUID
Cells Analyzed: 15
Cells Counted: 15
Cells Karyotyped: 2
Colonies: 15
GTG Band Resolution Achieved: 450

## 2020-11-16 NOTE — L&D Delivery Note (Signed)
Delivery Note Pt progressed to complete dilation and pushed about 10 minuted and at 10:34 PM a viable female was delivered via  (Presentation: ROA).  APGAR: 8,9 ; weight pending.   Placenta status:  Delivered spontaneously  Cord:   with the following complications:  none.  Terminal meconium.  Anesthesia: Epidural Episiotomy: none  Lacerations:  none Suture Repair: N/A Est. Blood Loss (mL):   Mom to postpartum.  Baby to Couplet care / Skin to Skin.  Baby transitioning well with no obvious issues, NICU available as needed.  Jacqueline Gray 01/06/2021, 10:45 PM

## 2020-11-19 ENCOUNTER — Ambulatory Visit: Payer: BC Managed Care – PPO | Attending: Obstetrics and Gynecology

## 2020-11-19 ENCOUNTER — Ambulatory Visit: Payer: BC Managed Care – PPO | Admitting: *Deleted

## 2020-11-19 ENCOUNTER — Other Ambulatory Visit: Payer: Self-pay | Admitting: *Deleted

## 2020-11-19 ENCOUNTER — Other Ambulatory Visit: Payer: Self-pay

## 2020-11-19 ENCOUNTER — Encounter: Payer: Self-pay | Admitting: *Deleted

## 2020-11-19 VITALS — BP 108/62 | HR 82

## 2020-11-19 DIAGNOSIS — O09523 Supervision of elderly multigravida, third trimester: Secondary | ICD-10-CM

## 2020-11-19 DIAGNOSIS — O283 Abnormal ultrasonic finding on antenatal screening of mother: Secondary | ICD-10-CM | POA: Diagnosis not present

## 2020-11-19 DIAGNOSIS — Z3A32 32 weeks gestation of pregnancy: Secondary | ICD-10-CM | POA: Diagnosis not present

## 2020-11-19 DIAGNOSIS — R9389 Abnormal findings on diagnostic imaging of other specified body structures: Secondary | ICD-10-CM | POA: Insufficient documentation

## 2020-11-19 DIAGNOSIS — Z362 Encounter for other antenatal screening follow-up: Secondary | ICD-10-CM

## 2020-11-19 IMAGING — US US MFM OB FOLLOW-UP
1 series · 14 of 25 positions shown · non-contrast
Comparison: none

[Series 1: us mfm ob follow-up · 25 acquisitions, 14 frames shown]
[im 1/25]
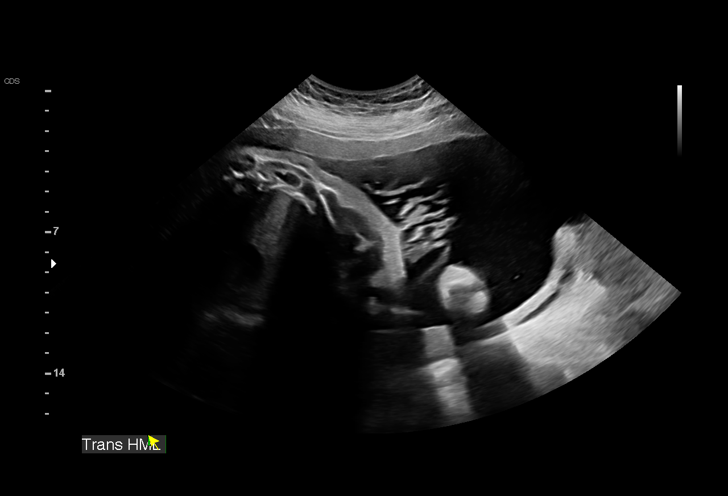
[im 3/25]
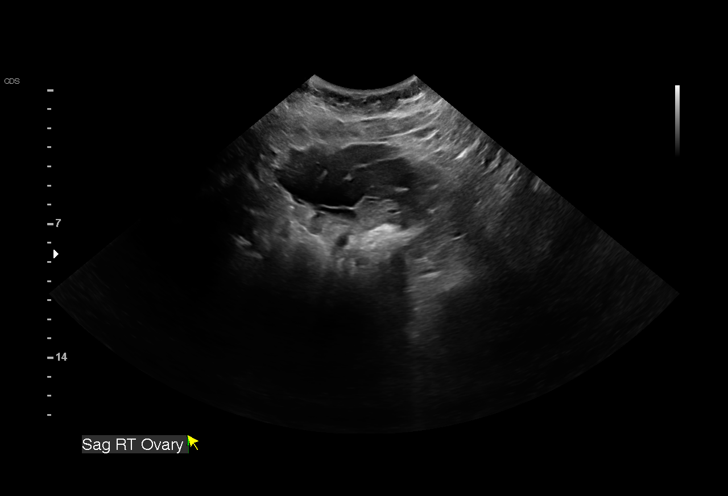
[im 5/25]
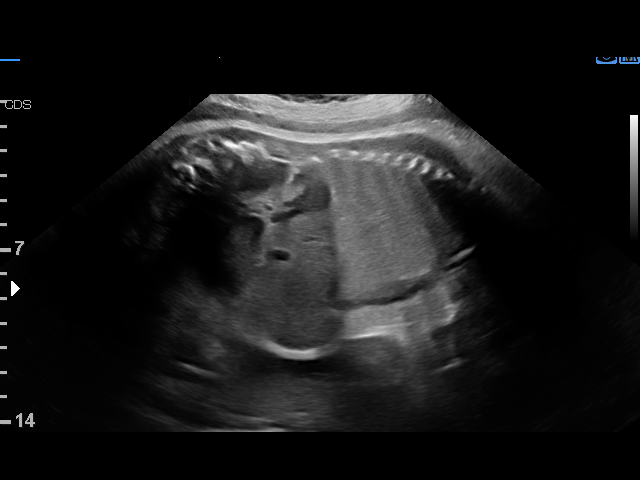
[im 7/25]
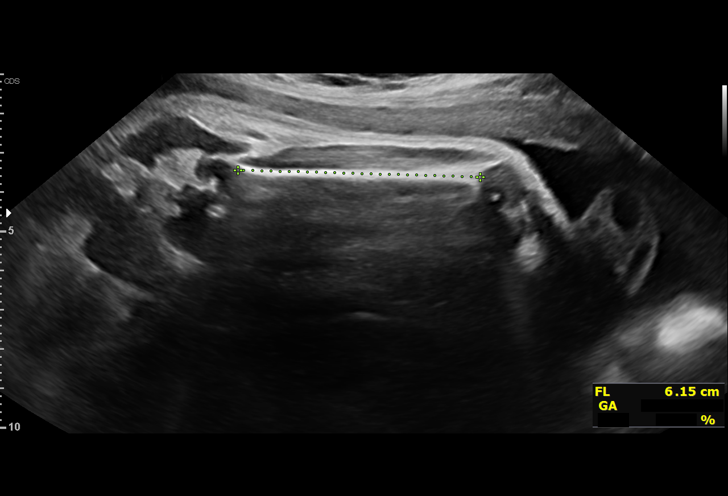
[im 9/25]
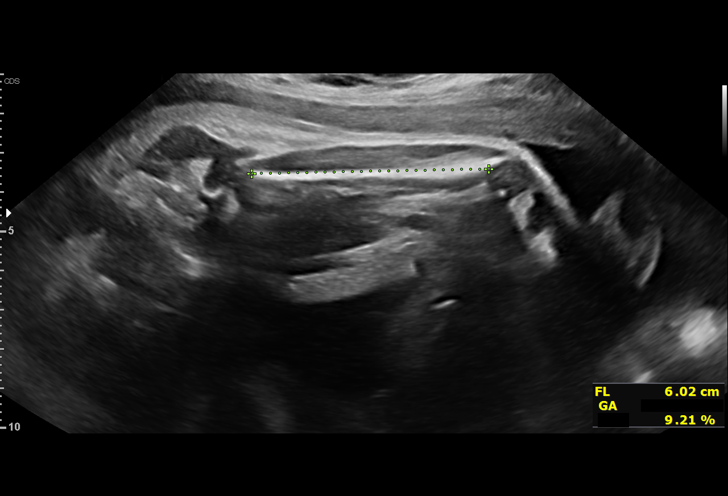
[im 10/25]
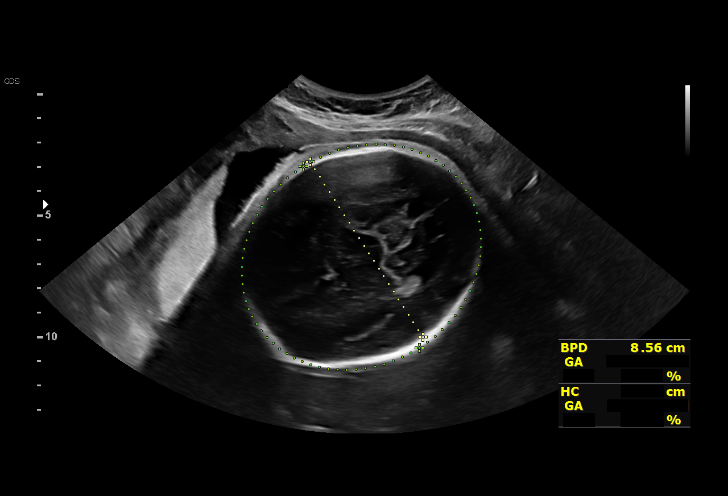
[im 12/25]
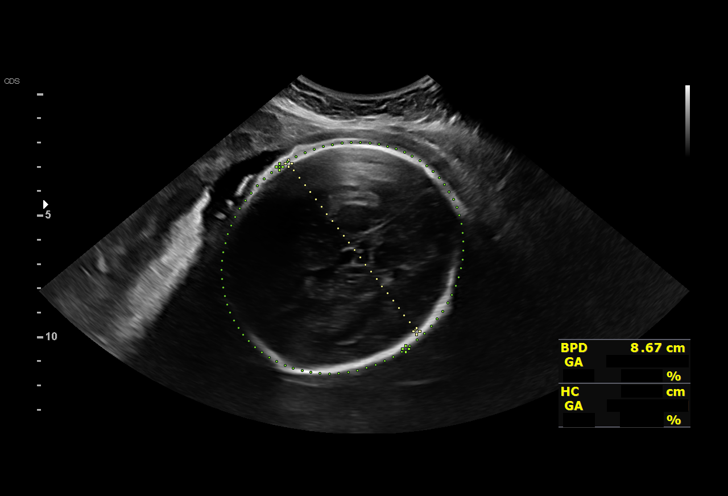
[im 14/25]
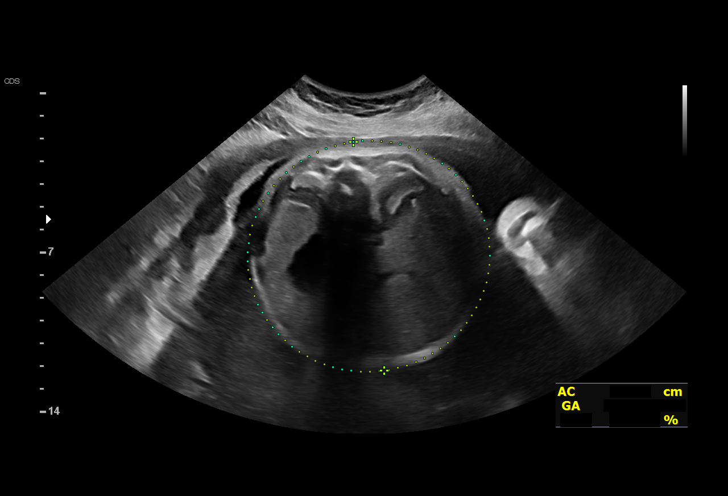
[im 16/25]
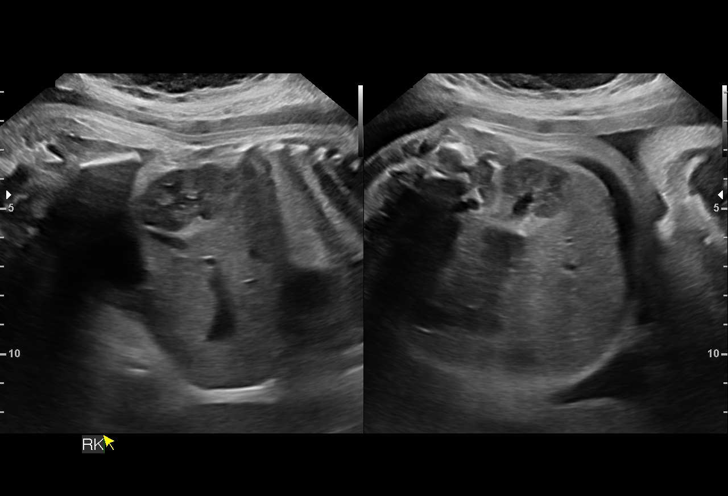
[im 17/25]
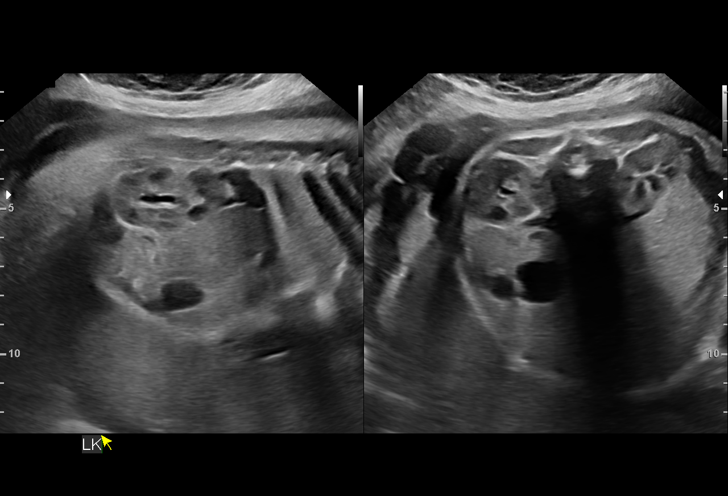
[im 19/25]
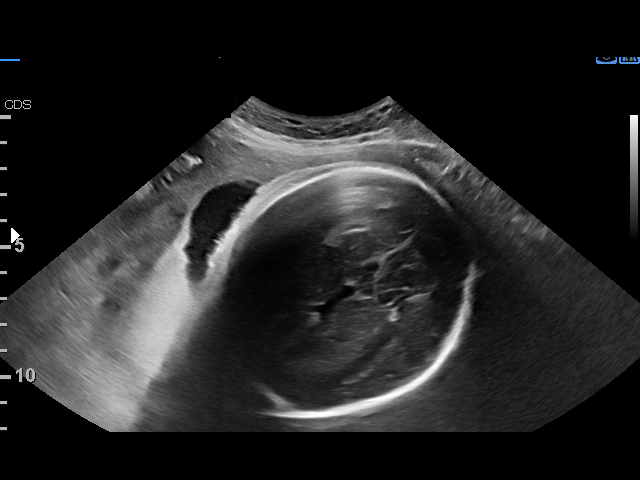
[im 21/25]
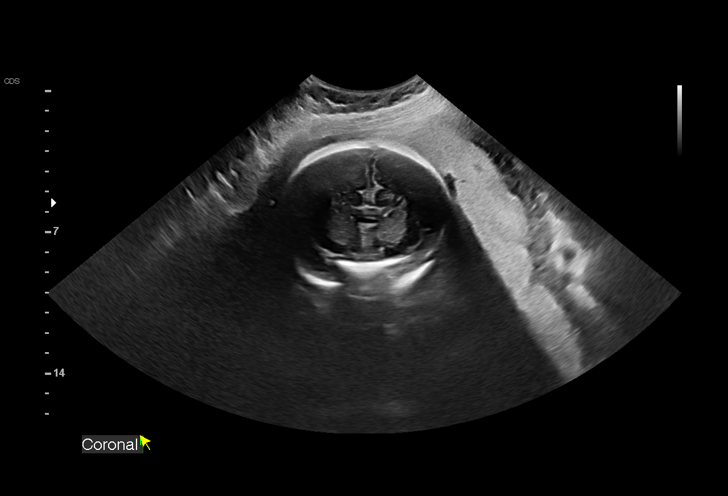
[im 23/25]
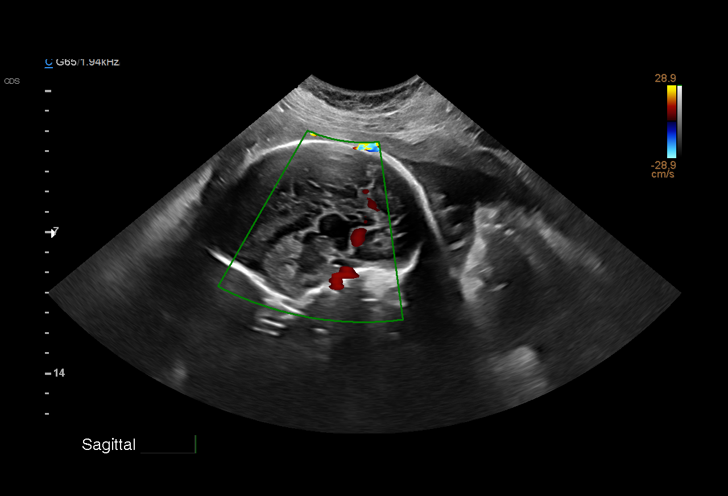
[im 25/25]
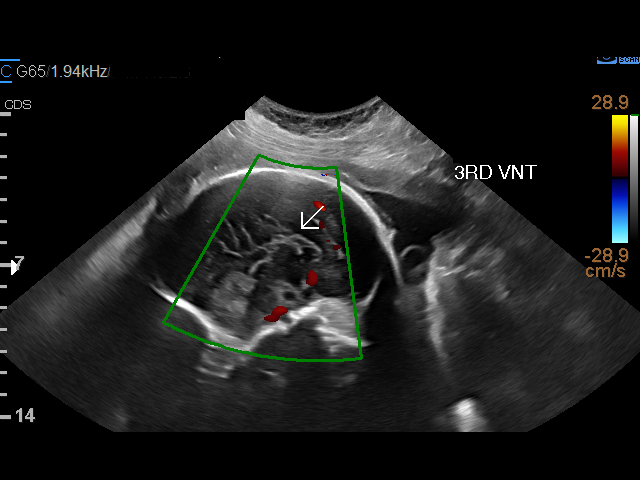

[14 of 25 positions shown; findings below may reference images not displayed]

DANII

                                                            OBGYN
                                                            [address]
 Referred By:      DANII

Indications

 32 weeks gestation of pregnancy
 Abnormal ultrasound finding on antenatal       [LT]
 screening of mother
 Encounter for other antenatal screening        [LT]
 follow-up
 Advanced maternal age multigravida 35+,        [LT]
 third trimester
Fetal Evaluation

 Num Of Fetuses:         1
 Fetal Heart Rate(bpm):  145
 Cardiac Activity:       Observed
 Presentation:           Transverse, head to maternal left
 Placenta:               Posterior Fundal
 P. Cord Insertion:      Previously Visualized

 Amniotic Fluid
 AFI FV:      Within normal limits

 AFI Sum(cm)     %Tile       Largest Pocket(cm)
 16.45           59

 RUQ(cm)       RLQ(cm)       LUQ(cm)        LLQ(cm)
 5
Biometry

 BPD:      86.3  mm     G. Age:  34w 6d         93  %    CI:        80.41   %    70 - 86
                                                         FL/HC:      20.0   %    19.9 -
 HC:       304   mm     G. Age:  33w 6d         41  %    HC/AC:      0.95        0.96 -
 AC:       319   mm     G. Age:  35w 6d       > 99  %    FL/BPD:     70.5   %    71 - 87
 FL:       60.8  mm     G. Age:  31w 4d         13  %    FL/AC:      19.1   %    20 - 24

 LV:          8  mm

 Est. FW:    [LT]  gm      5 lb 5 oz     88  %
OB History

 Gravidity:    4         Term:   2         SAB:   1
 Living:       2
Gestational Age

 LMP:           32w 5d        Date:  [DATE]                 EDD:   [DATE]
 U/S Today:     34w 0d                                        EDD:   [DATE]
 Best:          32w 5d     Det. By:  LMP  ([DATE])          EDD:   [DATE]
Anatomy

 Cranium:               Previously seen        LVOT:                   Previously seen
 Cavum:                 Abnormal, see          Aortic Arch:            Previously seen
                        comments
 Ventricles:            Abnormal, see          Ductal Arch:            Previously seen
                        comments
 Choroid Plexus:        Previously seen        Diaphragm:              Appears normal
 Cerebellum:            Previously seen        Stomach:                Appears normal, left
                                                                       sided
 Posterior Fossa:       Previously seen        Abdomen:                Previously seen
 Nuchal Fold:           Not applicable (>20    Abdominal Wall:         Previously seen
                        wks GA)
 Face:                  Orbits and profile     Cord Vessels:           Previously seen
                        previously seen
 Lips:                  Previously seen        Kidneys:                Appear normal
 Palate:                Previously seen        Bladder:                Appears normal
 Thoracic:              Previously seen        Spine:                  Previously seen
 Heart:                 Previously seen        Upper Extremities:      Previously seen
 RVOT:                  Previously seen        Lower Extremities:      Previously seen

 Other:  *Agenesis of the Corpus Callosum*. Fetus appears to be female.
         Nasal bone, Heels/feet, open hands/5th digits visualized; however,
         fingers appear long a thin. VC, 3VV and 3VTV visualized.
Cervix Uterus Adnexa

 Cervix
 Not visualized (advanced GA >[LT])

 Uterus
 No abnormality visualized.
 Right Ovary
 Within normal limits.

 Left Ovary
 Not visualized.
Impression

 Follow up growth performed for known Absent corpus
 callosum absent confirmed by MRI.
 Amniocentesis performed with noram 46 XX

 There is normal interval growth today with good fetal
 movement and amniotic fluid volume.

 Ms. DANII is 39 yo and therefore given associated risk of
 FGR and stillbirth we recommend continued serial growth
 and initiate weekly testing at 36 weeks.
Recommendations

 Follow up growth in 4 weeks.
 Initiate weekly testing at 36 weeks
 Consider delivery between 39-40 weeks.

## 2020-12-17 ENCOUNTER — Ambulatory Visit: Payer: BC Managed Care – PPO

## 2020-12-17 ENCOUNTER — Ambulatory Visit: Payer: BC Managed Care – PPO | Admitting: *Deleted

## 2020-12-17 ENCOUNTER — Other Ambulatory Visit: Payer: Self-pay | Admitting: Maternal & Fetal Medicine

## 2020-12-17 ENCOUNTER — Encounter: Payer: Self-pay | Admitting: *Deleted

## 2020-12-17 ENCOUNTER — Other Ambulatory Visit: Payer: Self-pay

## 2020-12-17 ENCOUNTER — Ambulatory Visit: Payer: BC Managed Care – PPO | Attending: Obstetrics and Gynecology

## 2020-12-17 VITALS — BP 125/74 | HR 85

## 2020-12-17 DIAGNOSIS — O283 Abnormal ultrasonic finding on antenatal screening of mother: Secondary | ICD-10-CM | POA: Diagnosis not present

## 2020-12-17 DIAGNOSIS — O09523 Supervision of elderly multigravida, third trimester: Secondary | ICD-10-CM | POA: Insufficient documentation

## 2020-12-17 DIAGNOSIS — Z3A36 36 weeks gestation of pregnancy: Secondary | ICD-10-CM

## 2020-12-17 DIAGNOSIS — O321XX0 Maternal care for breech presentation, not applicable or unspecified: Secondary | ICD-10-CM | POA: Diagnosis not present

## 2020-12-17 DIAGNOSIS — Z362 Encounter for other antenatal screening follow-up: Secondary | ICD-10-CM

## 2020-12-17 IMAGING — US US MFM OB FOLLOW-UP
1 series · 13 of 28 positions shown · non-contrast
Comparison: none

[Series 1: us mfm ob follow-up · 52 acquisitions, 13 frames shown]
[im 2/52]
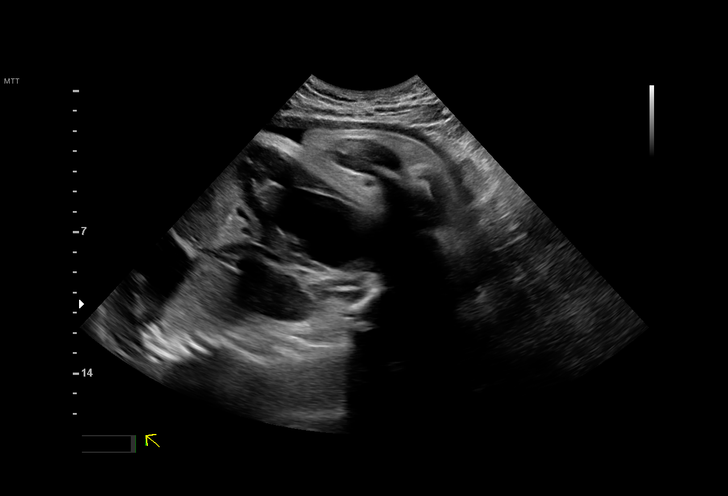
[im 6/52]
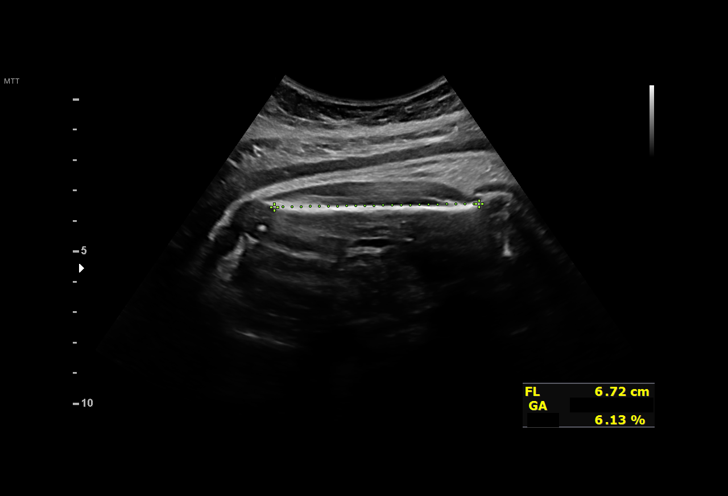
[im 10/52]
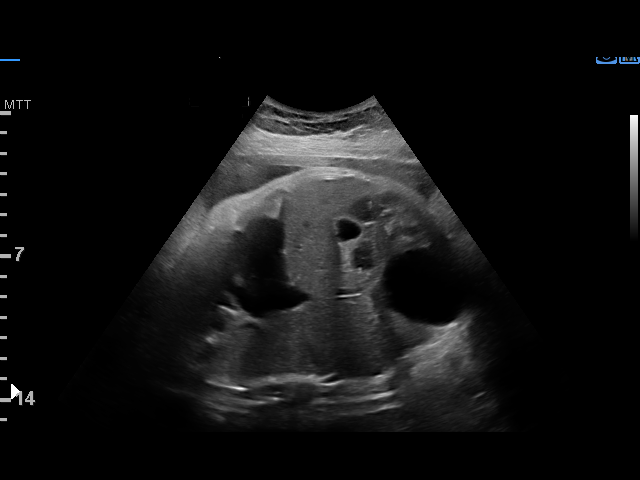
[im 14/52]
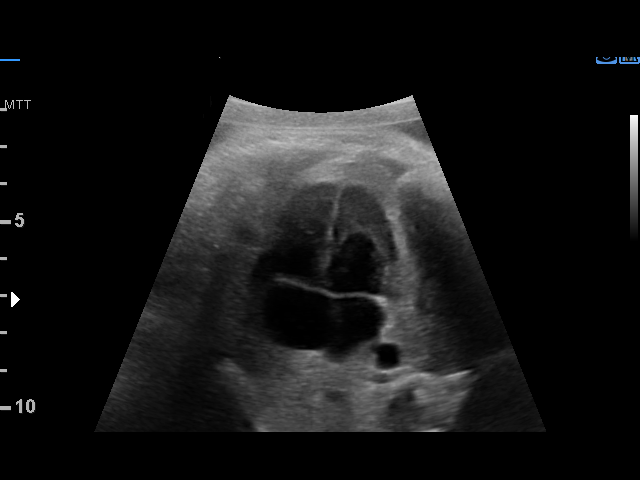
[im 18/52]
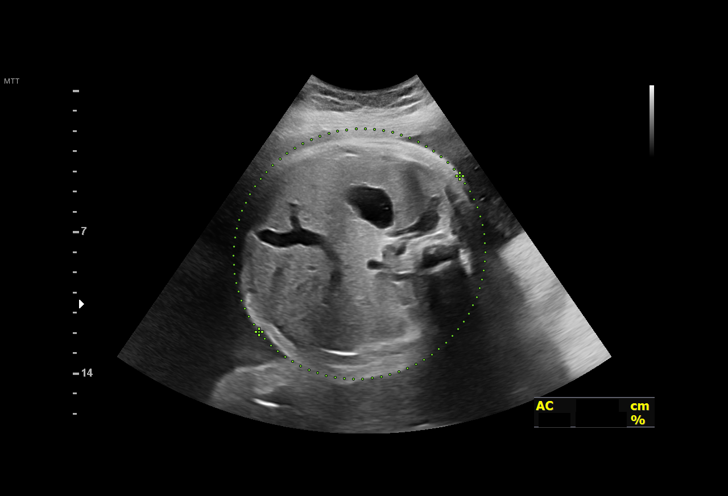
[im 21/52]
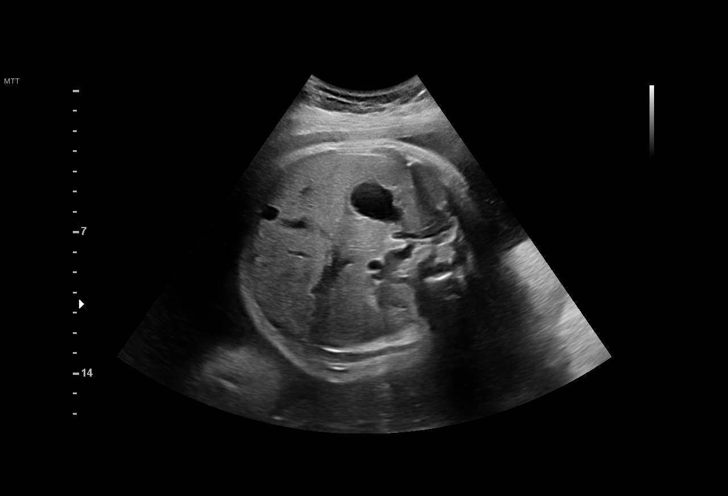
[im 27/52]
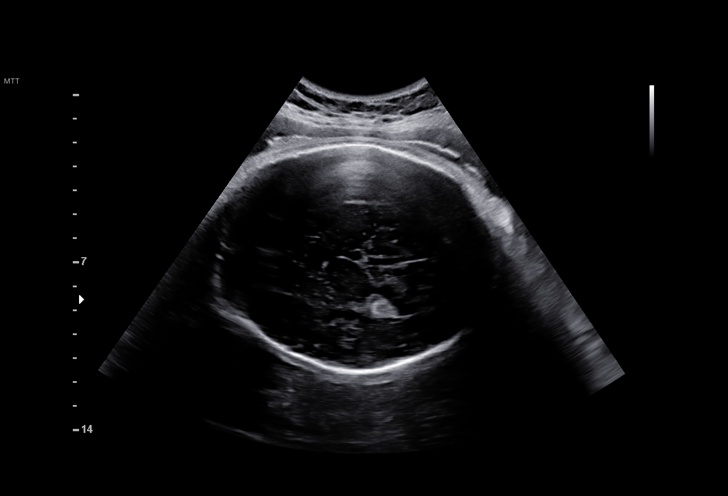
[im 31/52]
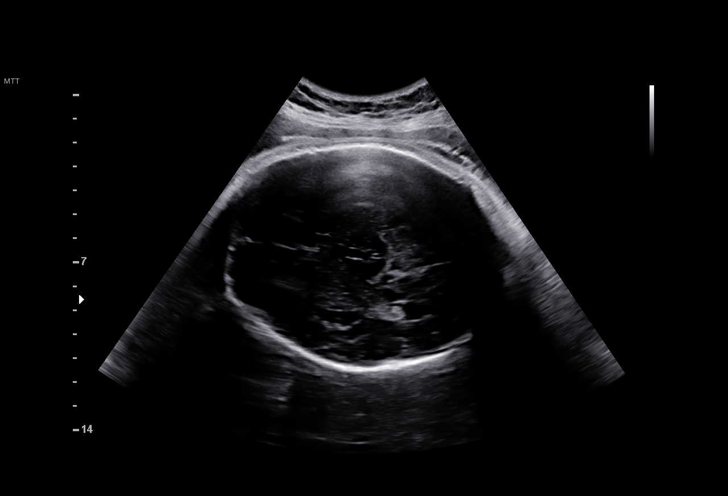
[im 35/52]
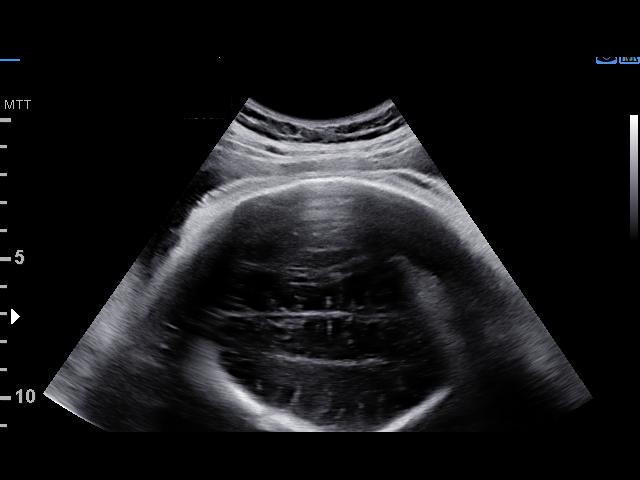
[im 38/52]
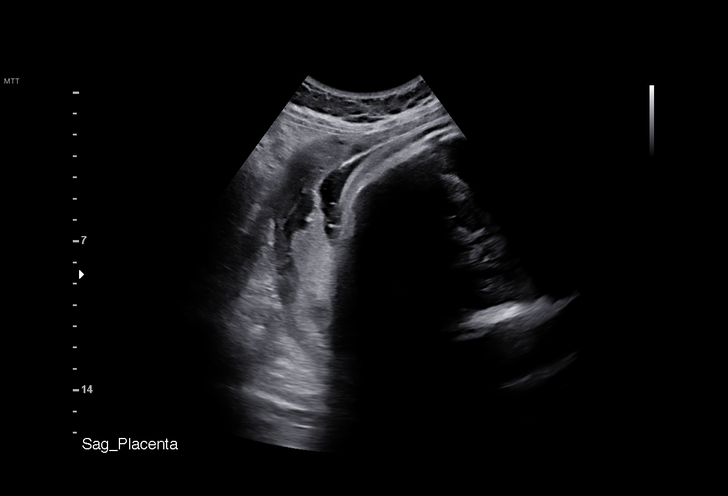
[im 42/52]
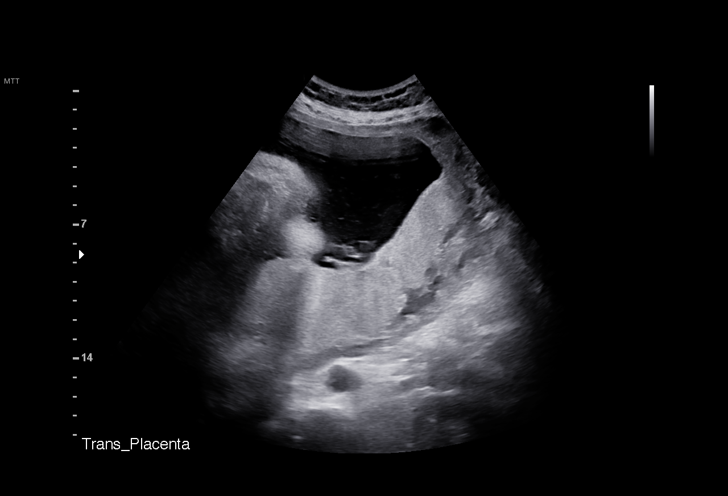
[im 46/52]
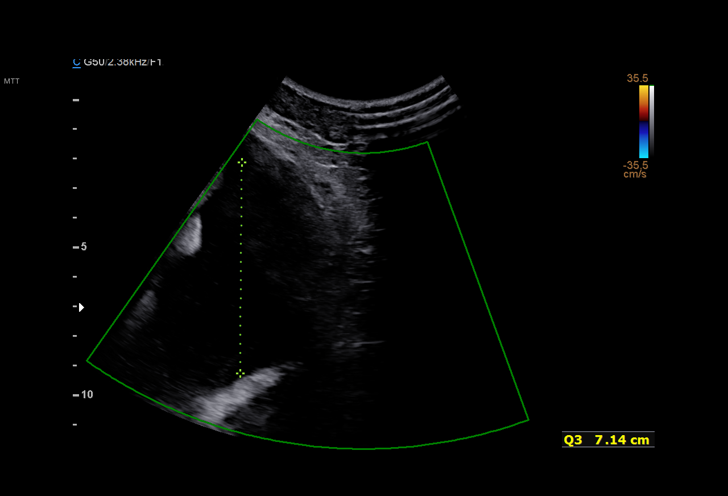
[im 50/52]
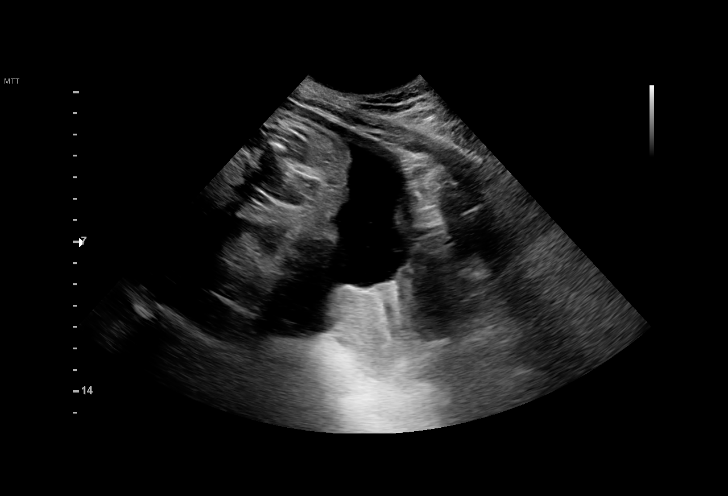

[13 of 28 positions shown; findings below may reference images not displayed]

[facility]ER

                                                            OBGYN
                                                            [address]
 Referred By:      JHUTON CEO

                                                      AST[facility]

Indications

 36 weeks gestation of pregnancy
 Abnormal ultrasound finding on antenatal
 screening of mother
 Encounter for other antenatal screening
 follow-up
 Advanced maternal age multigravida 35+,
 third trimester
Fetal Evaluation

 Num Of Fetuses:         1
 Fetal Heart Rate(bpm):  135
 Cardiac Activity:       Observed
 Presentation:           Breech
 Placenta:               Posterior Fundal
 P. Cord Insertion:      Previously Visualized

 Amniotic Fluid
 AFI FV:      Within normal limits

 AFI Sum(cm)     %Tile       Largest Pocket(cm)
 22.4            86
 RUQ(cm)       RLQ(cm)       LUQ(cm)        LLQ(cm)

Biophysical Evaluation

 Amniotic F.V:   Pocket => 2 cm             F. Tone:        Observed
 F. Movement:    Observed                   Score:          [DATE]
 F. Breathing:   Observed
Biometry

 BPD:      93.5  mm     G. Age:  38w 0d         91  %    CI:        73.88   %    70 - 86
                                                         FL/HC:      19.5   %    20.8 -
 HC:      345.5  mm     G. Age:  40w 0d         91  %    HC/AC:      0.87        0.92 -
 AC:      395.3  mm     G. Age:  43w 4d       > 99  %    FL/BPD:     71.9   %    71 - 87
 FL:       67.2  mm     G. Age:  34w 4d          6  %    FL/AC:      17.0   %    20 - 24

 LV:          9  mm

 Est. FW:    7222  gm      9 lb 5 oz   > 99  %
OB History

 Gravidity:    4         Term:   2         SAB:   1
 Living:       2
Gestational Age

 LMP:           36w 5d        Date:  04/04/20                 EDD:   01/09/21
 U/S Today:     39w 0d                                        EDD:   12/24/20
 Best:          36w 5d     Det. By:  LMP  (04/04/20)          EDD:   01/09/21
Anatomy

 Cranium:               Previously seen        LVOT:                   Previously seen
 Cavum:                 Abnormal, see          Aortic Arch:            Previously seen
                        comments
 Ventricles:            Abnormal, see          Ductal Arch:            Previously seen
                        comments
 Choroid Plexus:        Previously seen        Diaphragm:              Appears normal
 Cerebellum:            Previously seen        Stomach:                Appears normal, left
                                                                       sided
 Posterior Fossa:       Previously seen        Abdomen:                Previously seen
 Nuchal Fold:           Not applicable (>20    Abdominal Wall:         Previously seen
                        wks GA)
 Face:                  Orbits and profile     Cord Vessels:           Previously seen
                        previously seen
 Lips:                  Previously seen        Kidneys:                Appear normal
 Palate:                Previously seen        Bladder:                Appears normal
 Thoracic:              Previously seen        Spine:                  Previously seen
 Heart:                 Appears normal         Upper Extremities:      Previously seen
                        (4CH, axis, and
                        situs)
 RVOT:                  Previously seen        Lower Extremities:      Previously seen

 Other:  *Agenesis of the Corpus Callosum*. Fetus appears to be female.
         Nasal bone, Heels/feet, open hands/5th digits visualized; however,
         fingers appear long a thin. VC, 3VV and 3VTV visualized.
Cervix Uterus Adnexa

 Cervix
 Not visualized (advanced GA >76wks)

 Uterus
 No abnormality visualized.

 Right Ovary
 Within normal limits.

 Left Ovary
 Within normal limits.

 Cul De Sac
 No free fluid seen.

 Adnexa
 No abnormality visualized.
Impression

 Agenesis of corpus callosum confirmed on ultrasound and
 MRI.  Fetal karyotype and microarray were normal on
 amniocentesis.  Patient had NST at your office yesterday.

 On today's ultrasound, the estimated fetal weight is at the
 99th percentile.  Head circumference is at the 91st percentile.
 Breech presentation.  No umbilical cord is seen below the
 presenting part.
 Amniotic fluid is normal and good fetal activity is seen
 .Antenatal testing is reassuring. BPP [DATE].

 Patient is aware that if breech presentation persists cesarean
 section will be recommended.
Recommendations

 -Elective cesarean delivery at 39 weeks gestation if
 malpresentation persists.
 -Neonatologist to be informed of the diagnosis of agenesis of
 corpus callosum at delivery.
 -I do not expect urgent neonatal intervention after delivery.
                 Dibloni, Yahismo

## 2020-12-19 ENCOUNTER — Telehealth: Payer: Self-pay | Admitting: Genetic Counselor

## 2020-12-19 NOTE — Telephone Encounter (Signed)
LVM for Ms. Randie Heinz with the help of [address] [place], [place] [id number], informing her that I was calling to discuss the updated plan for once her baby is born. Requested a call back to my direct line to discuss this in detail.  Gershon Crane, MS, [facility] Genetic Counselor

## 2020-12-23 ENCOUNTER — Telehealth: Payer: Self-pay | Admitting: Genetic Counselor

## 2020-12-23 NOTE — Telephone Encounter (Signed)
I returned Ms. Jacqueline Gray call to discuss the updated postnatal care plan for her baby. I informed her that I was finally able to successfully fax over her pregnancy records to her children's pediatrician, Dr. Romualdo Bolk. She has already spoken with Dr. Romualdo Bolk and informed her about her baby's MRI findings. I also updated her on the plan following delivery at [facility]. The Neonatology team will be informed about the MRI findings at delivery in case the baby needs to stay in the NICU after birth. If the baby requires a NICU stay, the pediatric geneticist Dr. Roetta Sessions will see the baby on an inpatient basis and speak with Ms. Jacqueline Gray about further genetic testing options. If the baby does well and does not require at stay in the NICU, the Pediatric Genetics team will schedule an outpatient appointment for the child. Additionally, Dr. Roetta Sessions recommended that the baby have postnatal head imaging performed. I informed Ms. Jacqueline Gray that it is recommended that her baby be followed by Pediatric Neurology. Either Dr. Romualdo Bolk or physicians involved in the baby's postnatal care may make this referral.   Ms. Jacqueline Gray inquired about results from the [facility] research study for herself and her husband. I explained that since the fetal sample could not be used for testing (due to not having results from a chromosomal microarray), parental testing was not completed either. The genetics team will recommend appropriate genetic testing options after birth depending on results from head imaging and a physical examination. Ms. Jacqueline Gray confirmed that she had no further questions at this time.   Gershon Crane, MS, [facility] Genetic Counselor

## 2020-12-24 ENCOUNTER — Ambulatory Visit: Payer: BC Managed Care – PPO

## 2021-01-01 ENCOUNTER — Telehealth (HOSPITAL_COMMUNITY): Payer: Self-pay | Admitting: *Deleted

## 2021-01-01 NOTE — Telephone Encounter (Signed)
Preadmission screen  

## 2021-01-03 ENCOUNTER — Telehealth (HOSPITAL_COMMUNITY): Payer: Self-pay | Admitting: *Deleted

## 2021-01-03 NOTE — Telephone Encounter (Signed)
Interpreter number [phone]

## 2021-01-06 ENCOUNTER — Other Ambulatory Visit (HOSPITAL_COMMUNITY)
Admission: RE | Admit: 2021-01-06 | Discharge: 2021-01-06 | Disposition: A | Discharge status: Home / Self Care | Payer: BC Managed Care – PPO | Source: Ambulatory Visit | Attending: Obstetrics and Gynecology | Admitting: Obstetrics and Gynecology

## 2021-01-06 ENCOUNTER — Other Ambulatory Visit: Payer: Self-pay

## 2021-01-06 ENCOUNTER — Other Ambulatory Visit: Payer: Self-pay | Admitting: Obstetrics and Gynecology

## 2021-01-06 ENCOUNTER — Inpatient Hospital Stay (HOSPITAL_COMMUNITY): Payer: BC Managed Care – PPO | Admitting: Anesthesiology

## 2021-01-06 ENCOUNTER — Encounter (HOSPITAL_COMMUNITY): Payer: Self-pay | Admitting: Obstetrics and Gynecology

## 2021-01-06 ENCOUNTER — Inpatient Hospital Stay (HOSPITAL_COMMUNITY)
Admission: AD | Admit: 2021-01-06 | Discharge: 2021-01-08 | DRG: 807 | Disposition: A | Discharge status: Home / Self Care | Payer: BC Managed Care – PPO | Attending: Obstetrics and Gynecology | Admitting: Obstetrics and Gynecology

## 2021-01-06 DIAGNOSIS — O358XX0 Maternal care for other (suspected) fetal abnormality and damage, not applicable or unspecified: Secondary | ICD-10-CM | POA: Diagnosis present

## 2021-01-06 DIAGNOSIS — O3663X0 Maternal care for excessive fetal growth, third trimester, not applicable or unspecified: Secondary | ICD-10-CM | POA: Diagnosis present

## 2021-01-06 DIAGNOSIS — Z3A39 39 weeks gestation of pregnancy: Secondary | ICD-10-CM

## 2021-01-06 DIAGNOSIS — O26893 Other specified pregnancy related conditions, third trimester: Secondary | ICD-10-CM | POA: Diagnosis present

## 2021-01-06 LAB — CBC
HCT: 47.1 % — ABNORMAL HIGH (ref 36.0–46.0)
Hemoglobin: 15.5 g/dL — ABNORMAL HIGH (ref 12.0–15.0)
MCH: 29.1 pg (ref 26.0–34.0)
MCHC: 32.9 g/dL (ref 30.0–36.0)
MCV: 88.4 fL (ref 80.0–100.0)
Platelets: 295 10*3/uL (ref 150–400)
RBC: 5.33 MIL/uL — ABNORMAL HIGH (ref 3.87–5.11)
RDW: 13.9 % (ref 11.5–15.5)
WBC: 15.2 10*3/uL — ABNORMAL HIGH (ref 4.0–10.5)
nRBC: 0 % (ref 0.0–0.2)

## 2021-01-06 LAB — TYPE AND SCREEN
ABO/RH(D): A POS
Antibody Screen: NEGATIVE

## 2021-01-06 MED ORDER — OXYTOCIN-SODIUM CHLORIDE 30-0.9 UT/500ML-% IV SOLN: 1.0000 m[IU]/min | INTRAVENOUS | Status: DC

## 2021-01-06 MED ORDER — DIPHENHYDRAMINE HCL 50 MG/ML IJ SOLN: 12.5000 mg | INTRAMUSCULAR | Status: DC | PRN

## 2021-01-06 MED ORDER — OXYCODONE-ACETAMINOPHEN 5-325 MG PO TABS: 2.0000 | ORAL_TABLET | ORAL | Status: DC | PRN

## 2021-01-06 MED ORDER — LACTATED RINGERS IV SOLN
500.0000 mL | INTRAVENOUS | Status: DC | PRN
  Administered 2021-01-06: 250 mL via INTRAVENOUS
  Administered 2021-01-06: 1000 mL via INTRAVENOUS

## 2021-01-06 MED ORDER — ONDANSETRON HCL 4 MG/2ML IJ SOLN: 4.0000 mg | Freq: Four times a day (QID) | INTRAMUSCULAR | Status: DC | PRN

## 2021-01-06 MED ORDER — LIDOCAINE HCL (PF) 1 % IJ SOLN: 30.0000 mL | INTRAMUSCULAR | Status: DC | PRN

## 2021-01-06 MED ORDER — SOD CITRATE-CITRIC ACID 500-334 MG/5ML PO SOLN: 30.0000 mL | ORAL | Status: DC | PRN

## 2021-01-06 MED ORDER — LACTATED RINGERS IV SOLN: INTRAVENOUS | Status: DC

## 2021-01-06 MED ORDER — EPHEDRINE 5 MG/ML INJ: 10.0000 mg | INTRAVENOUS | Status: DC | PRN

## 2021-01-06 MED ORDER — OXYTOCIN BOLUS FROM INFUSION
333.0000 mL | Freq: Once | INTRAVENOUS | Status: AC
  Administered 2021-01-06: 333 mL via INTRAVENOUS

## 2021-01-06 MED ORDER — TERBUTALINE SULFATE 1 MG/ML IJ SOLN: 0.2500 mg | Freq: Once | INTRAMUSCULAR | Status: DC | PRN

## 2021-01-06 MED ORDER — OXYTOCIN BOLUS FROM INFUSION: 333.0000 mL | Freq: Once | INTRAVENOUS | Status: DC

## 2021-01-06 MED ORDER — PHENYLEPHRINE 40 MCG/ML (10ML) SYRINGE FOR IV PUSH (FOR BLOOD PRESSURE SUPPORT): 80.0000 ug | PREFILLED_SYRINGE | INTRAVENOUS | Status: DC | PRN

## 2021-01-06 MED ORDER — OXYCODONE-ACETAMINOPHEN 5-325 MG PO TABS: 1.0000 | ORAL_TABLET | ORAL | Status: DC | PRN

## 2021-01-06 MED ORDER — FLEET ENEMA 7-19 GM/118ML RE ENEM: 1.0000 | ENEMA | RECTAL | Status: DC | PRN

## 2021-01-06 MED ORDER — OXYTOCIN-SODIUM CHLORIDE 30-0.9 UT/500ML-% IV SOLN: 2.5000 [IU]/h | INTRAVENOUS | Status: DC

## 2021-01-06 MED ORDER — FENTANYL-BUPIVACAINE-NACL 0.5-0.125-0.9 MG/250ML-% EP SOLN
12.0000 mL/h | EPIDURAL | Status: DC | PRN
  Administered 2021-01-06 (×2): 12 mL/h via EPIDURAL
  Filled 2021-01-06: qty 250

## 2021-01-06 MED ORDER — LIDOCAINE-EPINEPHRINE (PF) 2 %-1:200000 IJ SOLN
INTRAMUSCULAR | Status: DC | PRN
  Administered 2021-01-06: 5 mL via EPIDURAL

## 2021-01-06 MED ORDER — BUTORPHANOL TARTRATE 1 MG/ML IJ SOLN
1.0000 mg | INTRAMUSCULAR | Status: DC | PRN
Start: 2021-01-06 — End: 2021-01-07

## 2021-01-06 MED ORDER — FENTANYL-BUPIVACAINE-NACL 0.5-0.125-0.9 MG/250ML-% EP SOLN: 12.0000 mL/h | EPIDURAL | Status: DC | PRN

## 2021-01-06 MED ORDER — OXYTOCIN-SODIUM CHLORIDE 30-0.9 UT/500ML-% IV SOLN
2.5000 [IU]/h | INTRAVENOUS | Status: DC
  Administered 2021-01-06: 2.5 [IU]/h via INTRAVENOUS
  Filled 2021-01-06: qty 500

## 2021-01-06 MED ORDER — LACTATED RINGERS IV SOLN
500.0000 mL | Freq: Once | INTRAVENOUS | Status: DC
Start: 2021-01-06 — End: 2021-01-06

## 2021-01-06 MED ORDER — ACETAMINOPHEN 325 MG PO TABS: 650.0000 mg | ORAL_TABLET | ORAL | Status: DC | PRN

## 2021-01-06 MED ORDER — LACTATED RINGERS IV SOLN
500.0000 mL | INTRAVENOUS | Status: DC | PRN
Start: 2021-01-06 — End: 2021-01-06

## 2021-01-06 MED ORDER — LACTATED RINGERS IV SOLN: 500.0000 mL | Freq: Once | INTRAVENOUS | Status: DC

## 2021-01-06 NOTE — Progress Notes (Signed)
Patient not tested for Covid on 01/06/21 because patient tested positive on 11/29/20.  Patient has email documentation with results.

## 2021-01-06 NOTE — Lactation Note (Signed)
This note was copied from a baby's chart. Lactation Consultation Note  Patient Name: Jacqueline Gray [id number] Date: 01/06/2021 Reason for consult: L&D Initial assessment;Term Age:41 hours  L&D consult with 66 minutes old infant and P3 mother. Parents are present at time of consult. Congratulated them on their newborn. Assisted with skin to skin prone on mother's chest. Discussed STS as ideal transition for infants after birth helping with temperature, blood sugar and comfort. Talked about primal reflexes such as rooting, hands to mouth, searching for the breast among others.   No latch or hand expression assistance at this time. Explained LC services availability during postpartum stay. Thanked family for their time.   Maternal Data Does the patient have breastfeeding experience prior to this delivery?: Yes How long did the patient breastfeed?: 15 months and 18 months  Feeding Mother's Current Feeding Choice: Breast Milk  Interventions Interventions: Skin to skin;Breast feeding basics reviewed;Expressed milk  Consult Status Consult Status: Follow-up Date: 01/07/21 Follow-up type: In-patient    Everlie Eble A Higuera Ancidey 01/06/2021, 11:05 PM

## 2021-01-06 NOTE — Anesthesia Procedure Notes (Signed)
Epidural Patient location during procedure: OB Start time: 01/06/2021 8:40 PM End time: 01/06/2021 8:50 PM  Staffing Anesthesiologist: Elmer Picker, MD Performed: anesthesiologist   Preanesthetic Checklist Completed: patient identified, IV checked, risks and benefits discussed, monitors and equipment checked, pre-op evaluation and timeout performed  Epidural Patient position: sitting Prep: DuraPrep and site prepped and draped Patient monitoring: continuous pulse ox, blood pressure, heart rate and cardiac monitor Approach: midline Location: L3-L4 Injection technique: LOR air  Needle:  Needle type: Tuohy  Needle gauge: 17 G Needle length: 9 cm Needle insertion depth: 5.5 cm Catheter type: closed end flexible Catheter size: 19 Gauge Catheter at skin depth: 11 cm Test dose: negative  Assessment Sensory level: T8 Events: blood not aspirated, injection not painful, no injection resistance, no paresthesia and negative IV test  Additional Notes Patient identified. Risks/Benefits/Options discussed with patient including but not limited to bleeding, infection, nerve damage, paralysis, failed block, incomplete pain control, headache, blood pressure changes, nausea, vomiting, reactions to medication both or allergic, itching and postpartum back pain. Confirmed with bedside nurse the patient's most recent platelet count. Confirmed with patient that they are not currently taking any anticoagulation, have any bleeding history or any family history of bleeding disorders. Patient expressed understanding and wished to proceed. All questions were answered. Sterile technique was used throughout the entire procedure. Please see nursing notes for vital signs. Test dose was given through epidural catheter and negative prior to continuing to dose epidural or start infusion. Warning signs of high block given to the patient including shortness of breath, tingling/numbness in hands, complete motor block,  or any concerning symptoms with instructions to call for help. Patient was given instructions on fall risk and not to get out of bed. All questions and concerns addressed with instructions to call with any issues or inadequate analgesia.  Reason for block:procedure for pain

## 2021-01-06 NOTE — MAU Note (Signed)
Pt reports contractions since this pm, denies ROM. Reports good feta movement.

## 2021-01-06 NOTE — Progress Notes (Signed)
Patient ID: Jacqueline Gray, female   DOB: [birth year], 40 y.o.   MRN: [id number] Pt comfortable with epidural  afeb VSS FHR category 1  Cervix c/c/0 BBOW ruptured with light meconium noted.  Will place pt on peanut and see how baby descends

## 2021-01-06 NOTE — Anesthesia Preprocedure Evaluation (Signed)
Anesthesia Evaluation  Patient identified by MRN, date of birth, ID band Patient awake    Reviewed: Allergy & Precautions, NPO status , Patient's Chart, lab work & pertinent test results  Airway Mallampati: II  TM Distance: >3 FB Neck ROM: Full    Dental no notable dental hx.    Pulmonary neg pulmonary ROS,    Pulmonary exam normal breath sounds clear to auscultation       Cardiovascular negative cardio ROS Normal cardiovascular exam Rhythm:Regular Rate:Normal     Neuro/Psych  Headaches, negative psych ROS   GI/Hepatic negative GI ROS, Neg liver ROS,   Endo/Other  negative endocrine ROS  Renal/GU negative Renal ROS  negative genitourinary   Musculoskeletal negative musculoskeletal ROS (+)   Abdominal   Peds  Hematology negative hematology ROS (+)   Anesthesia Other Findings   Reproductive/Obstetrics (+) Pregnancy                             Anesthesia Physical Anesthesia Plan  ASA: II  Anesthesia Plan: Epidural   Post-op Pain Management:    Induction:   PONV Risk Score and Plan: Treatment may vary due to age or medical condition  Airway Management Planned: Natural Airway  Additional Equipment:   Intra-op Plan:   Post-operative Plan:   Informed Consent: I have reviewed the patients History and Physical, chart, labs and discussed the procedure including the risks, benefits and alternatives for the proposed anesthesia with the patient or authorized representative who has indicated his/her understanding and acceptance.       Plan Discussed with: Anesthesiologist  Anesthesia Plan Comments: (Patient identified. Risks, benefits, options discussed with patient including but not limited to bleeding, infection, nerve damage, paralysis, failed block, incomplete pain control, headache, blood pressure changes, nausea, vomiting, reactions to medication, itching, and post partum back  pain. Confirmed with bedside nurse the patient's most recent platelet count. Confirmed with the patient that they are not taking any anticoagulation, have any bleeding history or any family history of bleeding disorders. Patient expressed understanding and wishes to proceed. All questions were answered. )        Anesthesia Quick Evaluation

## 2021-01-06 NOTE — H&P (Addendum)
Jacqueline Gray is a 40 y.o. female [id number] at 40 4/7 weeks (EDD 01/09/21 by LMP c/w 9 week [place]) presenting in active labor 8cm dilated.   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 Headache    Migraines  . SVD (spontaneous vaginal delivery) 05/13/2017  . SVD (spontaneous vaginal delivery) 06/08/2019   Past Surgical History:  Procedure Laterality Date  . NERVE AND TENDON REPAIR Left    arm  . tummy tuck  2004   Family History: family history includes Cancer in her mother. Social History:  reports that she has never smoked. She has never used smokeless tobacco. She reports that she does not drink alcohol and does not use drugs.     Maternal Diabetes: No Genetic Screening: Normal Maternal Ultrasounds/Referrals: Other: Absent corpus callosum, LGA Fetal Ultrasounds or other Referrals:  Referred to Materal Fetal Medicine  Maternal Substance Abuse:  No Significant Maternal Medications:  None Significant Maternal Lab Results:  Group B Strep negative Other Comments:  None  Review of Systems  Constitutional: Negative for fever.  Gastrointestinal: Positive for abdominal pain.   Maternal Medical History:  Reason for admission: Contractions.   Contractions: Onset was 3-5 hours ago.   Frequency: regular.   Perceived severity is strong.    Fetal activity: Perceived fetal activity is normal.     Prenatal complications: Baby with absent corpus callosum, suspected macrosomia, AMA  Prenatal Complications - Diabetes: none.    Dilation: 8 Effacement (%): 80 Station: -1 Exam by:: Traci,RN Blood pressure 92/60, pulse 75, temperature (!) 97.5 F (36.4 C), temperature source Oral, resp. rate 19, height 5' (1.524 m), weight 87.1 kg, last menstrual period 04/04/2020, SpO2 98 %, unknown if currently breastfeeding. Maternal Exam:  Uterine Assessment: Contraction strength is firm.  Contraction frequency is regular.   Abdomen: Patient reports no abdominal tenderness. Estimated fetal weight is 9-9 1/2 lbs.   Fetal presentation: vertex Abdominoplasty scar  Introitus: Normal vulva. Normal vagina.    Physical Exam Cardiovascular:     Rate and Rhythm: Normal rate and regular rhythm.  Pulmonary:     Effort: Pulmonary effort is normal.  Abdominal:     Palpations: Abdomen is soft.  Genitourinary:    General: Normal vulva.  Neurological:     Mental Status: She is alert.  Psychiatric:        Behavior: Behavior normal.     Prenatal labs: ABO, Rh: A positive Antibody: Negative Rubella: Immune (08/10 0000) RPR: Nonreactive (08/10 0000)  HBsAg: Negative (08/10 0000)  HIV: Non-reactive (08/10 0000)  GBS:  Negative One hour GCT 127 Essential panel negative 2018 Hgb AA  Assessment/Plan: Pt admitted in active labor and received epidural.  WHen comfortable will perform AROM. d/w  Pt potential LGA and risk of shoulder dystocia that cannot always be predicted and avoided.  D/w her would not perform operative vaginal delivery and if descent is arrested would recommend c-section.  She is agreeable.  NICU notified and will assess baby at delivery as needed but immediate interventions are not expected.  Pt will need pediatric neurology f/u after birth.    Oliver Pila 01/06/2021, 9:07 PM

## 2021-01-07 ENCOUNTER — Encounter (HOSPITAL_COMMUNITY): Payer: Self-pay | Admitting: Obstetrics and Gynecology

## 2021-01-07 LAB — CBC
HCT: 36.7 % (ref 36.0–46.0)
Hemoglobin: 12.3 g/dL (ref 12.0–15.0)
MCH: 29.4 pg (ref 26.0–34.0)
MCHC: 33.5 g/dL (ref 30.0–36.0)
MCV: 87.6 fL (ref 80.0–100.0)
Platelets: 212 10*3/uL (ref 150–400)
RBC: 4.19 MIL/uL (ref 3.87–5.11)
RDW: 13.9 % (ref 11.5–15.5)
WBC: 18 10*3/uL — ABNORMAL HIGH (ref 4.0–10.5)
nRBC: 0 % (ref 0.0–0.2)

## 2021-01-07 LAB — RPR: RPR Ser Ql: NONREACTIVE

## 2021-01-07 MED ORDER — IBUPROFEN 600 MG PO TABS
600.0000 mg | ORAL_TABLET | Freq: Four times a day (QID) | ORAL | Status: DC
  Administered 2021-01-07 (×4): 600 mg via ORAL
  Filled 2021-01-07 (×6): qty 1

## 2021-01-07 MED ORDER — BENZOCAINE-MENTHOL 20-0.5 % EX AERO: 1.0000 "application " | INHALATION_SPRAY | CUTANEOUS | Status: DC | PRN

## 2021-01-07 MED ORDER — PRENATAL MULTIVITAMIN CH
1.0000 | ORAL_TABLET | Freq: Every day | ORAL | Status: DC
  Administered 2021-01-07: 1 via ORAL
  Filled 2021-01-07: qty 1

## 2021-01-07 MED ORDER — ZOLPIDEM TARTRATE 5 MG PO TABS: 5.0000 mg | ORAL_TABLET | Freq: Every evening | ORAL | Status: DC | PRN

## 2021-01-07 MED ORDER — ONDANSETRON HCL 4 MG PO TABS: 4.0000 mg | ORAL_TABLET | ORAL | Status: DC | PRN

## 2021-01-07 MED ORDER — COCONUT OIL OIL: 1.0000 "application " | TOPICAL_OIL | Status: DC | PRN

## 2021-01-07 MED ORDER — SIMETHICONE 80 MG PO CHEW: 80.0000 mg | CHEWABLE_TABLET | ORAL | Status: DC | PRN

## 2021-01-07 MED ORDER — TETANUS-DIPHTH-ACELL PERTUSSIS 5-2.5-18.5 LF-MCG/0.5 IM SUSY: 0.5000 mL | PREFILLED_SYRINGE | Freq: Once | INTRAMUSCULAR | Status: DC

## 2021-01-07 MED ORDER — ACETAMINOPHEN 325 MG PO TABS
650.0000 mg | ORAL_TABLET | ORAL | Status: DC | PRN
Start: 2021-01-07 — End: 2021-01-08

## 2021-01-07 MED ORDER — WITCH HAZEL-GLYCERIN EX PADS: 1.0000 "application " | MEDICATED_PAD | CUTANEOUS | Status: DC | PRN

## 2021-01-07 MED ORDER — DIBUCAINE (PERIANAL) 1 % EX OINT: 1.0000 "application " | TOPICAL_OINTMENT | CUTANEOUS | Status: DC | PRN

## 2021-01-07 MED ORDER — SENNOSIDES-DOCUSATE SODIUM 8.6-50 MG PO TABS
2.0000 | ORAL_TABLET | ORAL | Status: DC
  Administered 2021-01-07: 2 via ORAL
  Filled 2021-01-07 (×2): qty 2

## 2021-01-07 MED ORDER — ONDANSETRON HCL 4 MG/2ML IJ SOLN: 4.0000 mg | INTRAMUSCULAR | Status: DC | PRN

## 2021-01-07 MED ORDER — DIPHENHYDRAMINE HCL 25 MG PO CAPS: 25.0000 mg | ORAL_CAPSULE | Freq: Four times a day (QID) | ORAL | Status: DC | PRN

## 2021-01-07 NOTE — Anesthesia Postprocedure Evaluation (Signed)
Anesthesia Post Note  Patient: Jacqueline Gray  Procedure(s) Performed: AN AD HOC LABOR EPIDURAL     Patient location during evaluation: Mother Baby Anesthesia Type: Epidural Level of consciousness: awake, awake and alert and oriented Pain management: pain level controlled Vital Signs Assessment: post-procedure vital signs reviewed and stable Respiratory status: spontaneous breathing and respiratory function stable Cardiovascular status: blood pressure returned to baseline Postop Assessment: no headache, epidural receding, patient able to bend at knees, adequate PO intake, no backache, no apparent nausea or vomiting and able to ambulate Anesthetic complications: no   No complications documented.  Last Vitals:  Vitals:   01/07/21 0113 01/07/21 0525  BP: 121/78 124/69  Pulse: 70 70  Resp: 18 18  Temp: 36.7 C 37 C  SpO2: 100% 97%    Last Pain:  Vitals:   01/07/21 0525  TempSrc:   PainSc: 0-No pain   Pain Goal:                   Cleda Clarks

## 2021-01-07 NOTE — Plan of Care (Signed)
  Problem: Education: Goal: Knowledge of condition will improve Note: Patient and significant other speak English and decline interpreter at this time. Encouraged patient that if she needs an interpreter for any reason to let staff know. Earl Gala, Linda Hedges [place]

## 2021-01-07 NOTE — Progress Notes (Signed)
POSTPARTUM PROGRESS NOTE  Post Partum Day #1  Subjective:  No acute events overnight.  Pt denies problems with ambulating, voiding or po intake.  She denies nausea or vomiting.  Pain is well controlled.  Lochia Minimal. Baby feeding well per mom  Objective: Blood pressure 124/69, pulse 70, temperature 98.6 F (37 C), resp. rate 18, height 5' (1.524 m), weight 87.1 kg, last menstrual period 04/04/2020, SpO2 97 %, unknown if currently breastfeeding.  Physical Exam:  General: alert, cooperative and no distress Lochia:normal flow Chest: CTAB Heart: RRR no m/r/g Abdomen: +BS, soft, nontender Uterine Fundus: firm, 1cm below umbilicus Extremities: neg edema, neg calf TTP BL, neg Homans BL  Recent Labs    01/06/21 2017 01/07/21 0506  HGB 15.5* 12.3  HCT 47.1* 36.7    Assessment/Plan:  ASSESSMENT: Jacqueline Gray is a 40 y.o. [id number] s/p SVD @ [redacted]w[redacted]d. PNC c/b AMA, LGA infant with agenesis of corpus callosum confirmed on MRI.   Plan for discharge tomorrow and Breastfeeding  Neuro follow up planned with peds pp   LOS: 1 day

## 2021-01-07 NOTE — Lactation Note (Signed)
This note was copied from a baby's chart. Lactation Consultation Note  Patient Name: Jacqueline Gray [id number] Date: 01/07/2021 Reason for consult: Follow-up assessment Age:41 hours   P3 mother whose infant is now 105 hours old.  This is a term baby at 39+4 weeks.  Baby was swaddled and asleep when I arrived.  Offered to assist with latching, however, mother desires to wait a little bit longer.  Since baby was not showing any cues I suggested the parents observe for feeding cues and call me back for latch assistance when she awakens.  Parents verbalized understanding.  RN updated.    Maternal Data    Feeding    LATCH Score                    Lactation Tools Discussed/Used    Interventions    Discharge    Consult Status Consult Status: Follow-up Date: 01/07/21 Follow-up type: In-patient    Anahis Furgeson R Link Burgeson 01/07/2021, 6:00 AM

## 2021-01-07 NOTE — Lactation Note (Signed)
This note was copied from a baby's chart. Lactation Consultation Note  Patient Name: Jacqueline Gray [name] [id number] Date: 01/07/2021 Reason for consult: Follow-up assessment Age:41 hours  P3 mother whose infant is now 21 hours old.  This is a term baby at 39+4 weeks.  Second LC visit today; parents did not have any questions/concerns related to breast feeding.  Mother declined lactation assistance with latching at this time.     Maternal Data    Feeding    LATCH Score Latch: Grasps breast easily, tongue down, lips flanged, rhythmical sucking.  Audible Swallowing: A few with stimulation  Type of Nipple: Everted at rest and after stimulation  Comfort (Breast/Nipple): Soft / non-tender  Hold (Positioning): Assistance needed to correctly position infant at breast and maintain latch.  LATCH Score: 8   Lactation Tools Discussed/Used    Interventions    Discharge    Consult Status Consult Status: Follow-up Date: 01/08/21 Follow-up type: In-patient    Kaila Devries R Camylle Whicker 01/07/2021, 2:24 PM

## 2021-01-08 ENCOUNTER — Inpatient Hospital Stay (HOSPITAL_COMMUNITY)
Admission: AD | Admit: 2021-01-08 | Payer: BC Managed Care – PPO | Source: Home / Self Care | Admitting: Obstetrics and Gynecology

## 2021-01-08 ENCOUNTER — Inpatient Hospital Stay (HOSPITAL_COMMUNITY): Payer: BC Managed Care – PPO

## 2021-01-08 MED ORDER — IBUPROFEN 600 MG PO TABS: 600.0000 mg | ORAL_TABLET | Freq: Four times a day (QID) | ORAL | 0 refills | Status: DC

## 2021-01-08 NOTE — Progress Notes (Signed)
PPD #2 Doing well Afeb, VSS D/c home 

## 2021-01-08 NOTE — Discharge Instructions (Signed)
As per discharge pamphlet °

## 2021-01-08 NOTE — Discharge Summary (Signed)
Postpartum Discharge Summary      Patient Name: Jacqueline Gray DOB: 03-Mar-1981 MRN: [id number]  Date of admission: 01/06/2021 Delivery date:01/06/2021  Delivering provider: Huel Cote  Date of discharge: 01/08/2021  Admitting diagnosis: Indication for care in labor or delivery [O75.9] Normal labor and delivery [O80] NSVD (normal spontaneous vaginal delivery) [O80] Intrauterine pregnancy: [redacted]w[redacted]d     Secondary diagnosis:  Active Problems:   Indication for care in labor or delivery   Normal labor and delivery   NSVD (normal spontaneous vaginal delivery)     Discharge diagnosis: Term Pregnancy Delivered                                              Hospital course: Onset of Labor With Vaginal Delivery      40 y.o. yo [id number] at [redacted]w[redacted]d was admitted in Active Labor on 01/06/2021. Patient had an uncomplicated labor course as follows:  Membrane Rupture Time/Date: 9:35 PM ,01/06/2021   Delivery Method:Vaginal, Spontaneous  Episiotomy: None  Lacerations:  None  Patient had an uncomplicated postpartum course.  She is ambulating, tolerating a regular diet, passing flatus, and urinating well. Patient is discharged home in stable condition on 01/08/21.  Newborn Data: Birth date:01/06/2021  Birth time:10:34 PM  Gender:Female  Living status:Living  Apgars:8 ,9  Weight:4204 g    Physical exam  Vitals:   01/07/21 0910 01/07/21 1430 01/07/21 2036 01/08/21 0545  BP: 130/74 116/60 134/80 112/72  Pulse: 84 77 67 72  Resp: 16 18 16 17   Temp: 98 F (36.7 C) 98 F (36.7 C) 98 F (36.7 C) 98.2 F (36.8 C)  TempSrc: Oral Oral Oral Oral  SpO2: 98% 99% 98% 98%  Weight:      Height:       General: alert Lochia: appropriate Uterine Fundus: firm  Labs: Lab Results  Component Value Date   WBC 18.0 (H) 01/07/2021   HGB 12.3 01/07/2021   HCT 36.7 01/07/2021   MCV 87.6 01/07/2021   PLT 212 01/07/2021   CMP Latest Ref Rng & Units 08/11/2019  Glucose 70 - 99 mg/dL 84  BUN 6 -  23 mg/dL 17  Creatinine 08/13/2019 - 5.17 mg/dL 0.01  Sodium 7.49 - 449 mEq/L 140  Potassium 3.5 - 5.1 mEq/L 4.2  Chloride 96 - 112 mEq/L 106  CO2 19 - 32 mEq/L 26  Calcium 8.4 - 10.5 mg/dL 9.3  Total Protein 6.0 - 8.3 g/dL 6.8  Total Bilirubin 0.2 - 1.2 mg/dL 0.3  Alkaline Phos 39 - 117 U/L 90  AST 0 - 37 U/L 14  ALT 0 - 35 U/L 21   Edinburgh Score: Edinburgh Postnatal Depression Scale Screening Tool 01/07/2021  I have been able to laugh and see the funny side of things. 0  I have looked forward with enjoyment to things. 0  I have blamed myself unnecessarily when things went wrong. 0  I have been anxious or worried for no good reason. 0  I have felt scared or panicky for no good reason. 0  Things have been getting on top of me. 1  I have been so unhappy that I have had difficulty sleeping. 0  I have felt sad or miserable. 0  I have been so unhappy that I have been crying. 0  The thought of harming myself has occurred to me. 0  Edinburgh Postnatal Depression Scale Total 1      After visit meds:  Allergies as of 01/08/2021      Reactions   Other Shortness Of Breath   Spicy peppers      Medication List    TAKE these medications   ibuprofen 600 MG tablet Commonly known as: ADVIL Take 1 tablet (600 mg total) by mouth every 6 (six) hours.   prenatal multivitamin Tabs tablet Take 1 tablet by mouth daily at 12 noon.        Discharge home in stable condition Infant Feeding: Breast Infant Disposition:home with mother Discharge instruction: per After Visit Summary and Postpartum booklet. Activity: Advance as tolerated. Pelvic rest for 6 weeks.  Diet: routine diet  Postpartum Appointment:6 weeks Follow up Visit:  Follow-up Information    Kirbi Farrugia, MD. Schedule an appointment as soon as possible for a visit in 6 week(s).   Specialty: Obstetrics and Gynecology Contact information: [address] [place] [address] [phone]                    01/08/2021 Zenaida Niece, MD

## 2021-01-09 LAB — SURGICAL PATHOLOGY

## 2021-10-27 ENCOUNTER — Ambulatory Visit (INDEPENDENT_AMBULATORY_CARE_PROVIDER_SITE_OTHER): Payer: BC Managed Care – PPO | Admitting: Medical

## 2021-10-27 VITALS — BP 107/47 | HR 70 | Temp 98.2°F | Resp 18 | Ht 60.0 in | Wt 171.0 lb

## 2021-10-27 DIAGNOSIS — Z23 Encounter for immunization: Secondary | ICD-10-CM | POA: Diagnosis not present

## 2021-10-27 DIAGNOSIS — Z0000 Encounter for general adult medical examination without abnormal findings: Secondary | ICD-10-CM | POA: Diagnosis not present

## 2021-10-27 LAB — COMPREHENSIVE METABOLIC PANEL
ALT: 15 U/L (ref 0–35)
AST: 11 U/L (ref 0–37)
Albumin: 4.1 g/dL (ref 3.5–5.2)
Alkaline Phosphatase: 102 U/L (ref 39–117)
BUN: 19 mg/dL (ref 6–23)
CO2: 26 mEq/L (ref 19–32)
Calcium: 9.4 mg/dL (ref 8.4–10.5)
Chloride: 106 mEq/L (ref 96–112)
Creatinine, Ser: 0.69 mg/dL (ref 0.40–1.20)
GFR: 107.54 mL/min (ref >60.00)
Glucose, Bld: 79 mg/dL (ref 70–99)
Potassium: 4.3 mEq/L (ref 3.5–5.1)
Sodium: 141 mEq/L (ref 135–145)
Total Bilirubin: 0.4 mg/dL (ref 0.2–1.2)
Total Protein: 6.8 g/dL (ref 6.0–8.3)

## 2021-10-27 LAB — CBC WITH DIFFERENTIAL/PLATELET
Basophils Absolute: 0 10*3/uL (ref 0.0–0.1)
Basophils Relative: 0.4 % (ref 0.0–3.0)
Eosinophils Absolute: 0 10*3/uL (ref 0.0–0.7)
Eosinophils Relative: 0.5 % (ref 0.0–5.0)
HCT: 41.1 % (ref 36.0–46.0)
Hemoglobin: 13.4 g/dL (ref 12.0–15.0)
Lymphocytes Relative: 33.9 % (ref 12.0–46.0)
Lymphs Abs: 2.8 10*3/uL (ref 0.7–4.0)
MCHC: 32.5 g/dL (ref 30.0–36.0)
MCV: 88 fl (ref 78.0–100.0)
Monocytes Absolute: 0.5 10*3/uL (ref 0.1–1.0)
Monocytes Relative: 5.6 % (ref 3.0–12.0)
Neutro Abs: 4.9 10*3/uL (ref 1.4–7.7)
Neutrophils Relative %: 59.6 % (ref 43.0–77.0)
Platelets: 383 10*3/uL (ref 150.0–400.0)
RBC: 4.68 Mil/uL (ref 3.87–5.11)
RDW: 13 % (ref 11.5–15.5)
WBC: 8.2 10*3/uL (ref 4.0–10.5)

## 2021-10-27 LAB — LIPID PANEL
Cholesterol: 163 mg/dL (ref 0–200)
HDL: 55.3 mg/dL (ref >39.00)
LDL Cholesterol: 89 mg/dL (ref 0–99)
NonHDL: 108.19
Total CHOL/HDL Ratio: 3
Triglycerides: 98 mg/dL (ref 0.0–149.0)
VLDL: 19.6 mg/dL (ref 0.0–40.0)

## 2021-10-27 NOTE — Patient Instructions (Addendum)
For you wellness exam today I have ordered cbc, cmp and lipid panel.  Flu vaccine today.  Recommend exercise and healthy diet.  We will let you know lab results as they come in.  Follow up date appointment will be determined after lab review.    You have lower end bp level today. On discussion your mom and grandmom both have lower end bp. Advise avoid dehydration. Will check blood volume today on labs. If you are getting dizzy when standing frequently let us know. You can add some salt to your diet/don't have be real restrictive as you describe have been.  Preventive Care 53-46 Years Old, Female Preventive care refers to lifestyle choices and visits with your health care provider that can promote health and wellness. Preventive care visits are also called wellness exams. What can I expect for my preventive care visit? Counseling Your health care provider may ask you questions about your: Medical history, including: Past medical problems. Family medical history. Pregnancy history. Current health, including: Menstrual cycle. Method of birth control. Emotional well-being. Home life and relationship well-being. Sexual activity and sexual health. Lifestyle, including: Alcohol, nicotine or tobacco, and drug use. Access to firearms. Diet, exercise, and sleep habits. Work and work Statistician. Sunscreen use. Safety issues such as seatbelt and bike helmet use. Physical exam Your health care provider will check your: Height and weight. These may be used to calculate your BMI (body mass index). BMI is a measurement that tells if you are at a healthy weight. Waist circumference. This measures the distance around your waistline. This measurement also tells if you are at a healthy weight and may help predict your risk of certain diseases, such as type 2 diabetes and high blood pressure. Heart rate and blood pressure. Body temperature. Skin for abnormal spots. What immunizations do I  need? Vaccines are usually given at various ages, according to a schedule. Your health care provider will recommend vaccines for you based on your age, medical history, and lifestyle or other factors, such as travel or where you work. What tests do I need? Screening Your health care provider may recommend screening tests for certain conditions. This may include: Lipid and cholesterol levels. Diabetes screening. This is done by checking your blood sugar (glucose) after you have not eaten for a while (fasting). Pelvic exam and Pap test. Hepatitis B test. Hepatitis C test. HIV (human immunodeficiency virus) test. STI (sexually transmitted infection) testing, if you are at risk. Lung cancer screening. Colorectal cancer screening. Mammogram. Talk with your health care provider about when you should start having regular mammograms. This may depend on whether you have a family history of breast cancer. BRCA-related cancer screening. This may be done if you have a family history of breast, ovarian, tubal, or peritoneal cancers. Bone density scan. This is done to screen for osteoporosis. Talk with your health care provider about your test results, treatment options, and if necessary, the need for more tests. Follow these instructions at home: Eating and drinking  Eat a diet that includes fresh fruits and vegetables, whole grains, lean protein, and low-fat dairy products. Take vitamin and mineral supplements as recommended by your health care provider. Do not drink alcohol if: Your health care provider tells you not to drink. You are pregnant, may be pregnant, or are planning to become pregnant. If you drink alcohol: Limit how much you have to 0-1 drink a day. Know how much alcohol is in your drink. In the [place] one drink equals one 12  oz bottle of beer (355 mL), one 5 oz glass of wine (148 mL), or one 1 oz glass of hard liquor (44 mL). Lifestyle Brush your teeth every morning and night with  fluoride toothpaste. Floss one time each day. Exercise for at least 30 minutes 5 or more days each week. Do not use any products that contain nicotine or tobacco. These products include cigarettes, chewing tobacco, and vaping devices, such as e-cigarettes. If you need help quitting, ask your health care provider. Do not use drugs. If you are sexually active, practice safe sex. Use a condom or other form of protection to prevent STIs. If you do not wish to become pregnant, use a form of birth control. If you plan to become pregnant, see your health care provider for a prepregnancy visit. Take aspirin only as told by your health care provider. Make sure that you understand how much to take and what form to take. Work with your health care provider to find out whether it is safe and beneficial for you to take aspirin daily. Find healthy ways to manage stress, such as: Meditation, yoga, or listening to music. Journaling. Talking to a trusted person. Spending time with friends and family. Minimize exposure to UV radiation to reduce your risk of skin cancer. Safety Always wear your seat belt while driving or riding in a vehicle. Do not drive: If you have been drinking alcohol. Do not ride with someone who has been drinking. When you are tired or distracted. While texting. If you have been using any mind-altering substances or drugs. Wear a helmet and other protective equipment during sports activities. If you have firearms in your house, make sure you follow all gun safety procedures. Seek help if you have been physically or sexually abused. What's next? Visit your health care provider once a year for an annual wellness visit. Ask your health care provider how often you should have your eyes and teeth checked. Stay up to date on all vaccines. This information is not intended to replace advice given to you by your health care provider. Make sure you discuss any questions you have with your health  care provider. Document Revised: 04/30/2021 Document Reviewed: 04/30/2021 Elsevier Patient Education  [employer].

## 2021-10-27 NOTE — Progress Notes (Signed)
Subjective:    Patient ID: Jacqueline Gray, female    DOB: 1981-10-17, 40 y.o.   MRN: [id number]  HPI  Pt in for wellness exam.   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 Pt states eating healthy. No smoker. No alcohol.  Pt had 3 covid vaccines. Will get flu vaccine today.  Pt is still breast feeding. Will get 6 months after she stops breast feeding. She will call her gyn office to get scheduled for pap.     Review of Systems  Constitutional:  Negative for chills, fatigue, fever and unexpected weight change.  HENT:  Negative for congestion, ear discharge and ear pain.   Respiratory:  Negative for cough, chest tightness, shortness of breath and wheezing.   Cardiovascular:  Negative for chest pain and palpitations.  Gastrointestinal:  Negative for abdominal pain, blood in stool, diarrhea, nausea and vomiting.  Genitourinary:  Negative for difficulty urinating, dysuria, enuresis, frequency, hematuria and menstrual problem.  Musculoskeletal:  Negative for back pain, gait problem, myalgias and neck pain.  Skin:  Negative for rash.  Neurological:  Negative for dizziness, seizures, syncope, weakness, light-headedness, numbness and headaches.  Hematological:  Negative for adenopathy. Does not bruise/bleed easily.  Psychiatric/Behavioral:  Negative for behavioral problems, decreased concentration, hallucinations and suicidal ideas. The patient is not nervous/anxious and is not hyperactive.     Past Medical History:  Diagnosis Date   Headache    Migraines   SVD (spontaneous vaginal delivery) 05/13/2017   SVD (spontaneous vaginal delivery) 06/08/2019     Social History   Socioeconomic History   Marital status: Married    Spouse name: Not on file   Number of children: Not on file   Years of education: Not on file   Highest education level: Not on file  Occupational History   Not on file  Tobacco Use   Smoking status: Never   Smokeless  tobacco: Never  Vaping Use   Vaping Use: Never used  Substance and Sexual Activity   Alcohol use: No   Drug use: No   Sexual activity: Yes  Other Topics Concern   Not on file  Social History Narrative   Not on file   Social Determinants of Health   Financial Resource Strain: Not on file  Food Insecurity: Not on file  Transportation Needs: Not on file  Physical Activity: Not on file  Stress: Not on file  Social Connections: Not on file  Intimate Partner Violence: Not on file    Past Surgical History:  Procedure Laterality Date   NERVE AND TENDON REPAIR Left    arm   tummy tuck  2004    Family History  Problem Relation Age of Onset   Cancer Mother     Allergies  Allergen Reactions   Other Shortness Of Breath    Spicy peppers    Current Outpatient Medications on File Prior to Visit  Medication Sig Dispense Refill   ibuprofen (ADVIL) 600 MG tablet Take 1 tablet (600 mg total) by mouth every 6 (six) hours. 30 tablet 0   Prenatal Vit-Fe Fumarate-FA (PRENATAL MULTIVITAMIN) TABS tablet Take 1 tablet by mouth daily at 12 noon.     No current facility-administered medications on file prior to visit.    BP (!) 107/47   Pulse 70   Temp 98.2 F (36.8 C)   Resp 18   Ht 5' (1.524 m)   Wt 171 lb (77.6 kg)  SpO2 97%   BMI 33.40 kg/m       Objective:   Physical Exam  General Mental Status- Alert. General Appearance- Not in acute distress.   Skin General: Color- Normal Color. Moisture- Normal Moisture.  Neck Carotid Arteries- Normal color. Moisture- Normal Moisture. No carotid bruits. No JVD.  Chest and Lung Exam Auscultation: Breath Sounds:-Normal.  Cardiovascular Auscultation:Rythm- Regular. Murmurs & Other Heart Sounds:Auscultation of the heart reveals- No Murmurs.  Abdomen Inspection:-Inspeection Normal. Palpation/Percussion:Note:No mass. Palpation and Percussion of the abdomen reveal- Non Tender, Non Distended + BS, no rebound or  guarding.   Neurologic Cranial Nerve exam:- CN III-XII intact(No nystagmus), symmetric smile. Strength:- 5/5 equal and symmetric strength both upper and lower extremities.       Assessment & Plan:    For you wellness exam today I have ordered cbc, cmp and lipid panel.  Flu vaccine today.  Recommend exercise and healthy diet.  We will let you know lab results as they come in.  Follow up date appointment will be determined after lab review.    You have lower end bp level today. On discussion your mom and grandmom both have lower end bp. Advise avoid dehydration. Will check blood volume today on labs. If you are getting dizzy when standing frequently let us know. You can add some salt to your diet/don't have be real restrictive as you describe have been.  Mackie Pai, PA-C

## 2023-08-07 ENCOUNTER — Ambulatory Visit
Admission: RE | Admit: 2023-08-07 | Discharge: 2023-08-07 | Disposition: A | Discharge status: Home / Self Care | Payer: BC Managed Care – PPO | Source: Ambulatory Visit | Attending: Internal Medicine

## 2023-08-07 VITALS — BP 150/74 | HR 83 | Temp 98.9°F | Resp 16

## 2023-08-07 DIAGNOSIS — H1031 Unspecified acute conjunctivitis, right eye: Secondary | ICD-10-CM | POA: Diagnosis not present

## 2023-08-07 MED ORDER — OFLOXACIN 0.3 % OP SOLN: 1.0000 [drp] | Freq: Four times a day (QID) | OPHTHALMIC | 0 refills | Status: AC

## 2023-08-07 NOTE — Discharge Instructions (Signed)
Start ofloxacin antibiotic eyedrops as prescribed.  Please follow-up with your ophthalmologist next week for further recheck.  Please go to the ER if you develop any worsening symptoms prior to seeing your ophthalmologist.  This includes but is not limited to eye pain, visual changes, or any new concerns that arise.  I hope you feel better soon!

## 2023-08-07 NOTE — ED Provider Notes (Signed)
[facility]    CSN: [id number] Arrival date & time: 08/07/23  0940      History   Chief Complaint Chief Complaint  Patient presents with   Eye Problem    Entered by patient    HPI Jacqueline Gray is a 42 y.o. female presents for possible foreign body in eye.  Patient reports 2 weeks ago while at work she thinks a piece of wood may have got into her right eye.  She reports she went to work since then has had been having some redness, foreign body sensation, watery drainage.  Denies visual changes, eye pain.  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  She does not wear contacts because she does wear glasses.  She has been using some over-the-counter lubricant eyedrops as well.  No other concerns at this time.   Eye Problem Associated symptoms: discharge and redness     Past Medical History:  Diagnosis Date   Headache    Migraines   SVD (spontaneous vaginal delivery) 05/13/2017   SVD (spontaneous vaginal delivery) 06/08/2019    Patient Active Problem List   Diagnosis Date Noted   NSVD (normal spontaneous vaginal delivery) 01/07/2021   Normal labor and delivery 01/06/2021   SVD (spontaneous vaginal delivery) 06/08/2019   Indication for care in labor or delivery 05/13/2017    Past Surgical History:  Procedure Laterality Date   NERVE AND TENDON REPAIR Left    arm   tummy tuck  2004    OB History     Gravida  4   Para  3   Term  3   Preterm      AB  1   Living  3      SAB  1   IAB      Ectopic      Multiple  0   Live Births  3            Home Medications    Prior to Admission medications   Medication Sig Start Date End Date Taking? Authorizing Provider  ofloxacin (OCUFLOX) 0.3 % ophthalmic solution Place 1 drop into the right eye 4 (four) times daily for 7 days. 08/07/23 08/14/23 Yes Radford Pax, NP  ibuprofen (ADVIL) 600 MG tablet Take 1 tablet (600 mg total) by mouth every 6 (six) hours. 01/08/21   Meisinger,  Tawanna Cooler, MD  Prenatal Vit-Fe Fumarate-FA (PRENATAL MULTIVITAMIN) TABS tablet Take 1 tablet by mouth daily at 12 noon.    [provider]    Family History Family History  Problem Relation Age of Onset   Cancer Mother     Social History Social History   Tobacco Use   Smoking status: Never   Smokeless tobacco: Never  Vaping Use   Vaping status: Never Used  Substance Use Topics   Alcohol use: No   Drug use: No     Allergies   Other   Review of Systems Review of Systems  Eyes:  Positive for discharge and redness.     Physical Exam Triage Vital Signs ED Triage Vitals  Encounter Vitals Group     BP 08/07/23 0947 (!) 150/74     Systolic BP Percentile --      Diastolic BP Percentile --      Pulse Rate 08/07/23 0947 83     Resp 08/07/23 0947 16     Temp 08/07/23 0947 98.9 F (37.2 C)     Temp Source 08/07/23  0947 Oral     SpO2 08/07/23 0947 96 %     Weight --      Height --      Head Circumference --      Peak Flow --      Pain Score 08/07/23 0948 2     Pain Loc --      Pain Education --      Exclude from Growth Chart --    No data found.  Updated Vital Signs BP (!) 150/74 (BP Location: Right Arm)   Pulse 83   Temp 98.9 F (37.2 C) (Oral)   Resp 16   LMP 07/06/2023 (Exact Date)   SpO2 96%   Breastfeeding Yes   Visual Acuity Right Eye Distance:   Left Eye Distance:   Bilateral Distance:    Right Eye Near:   Left Eye Near:    Bilateral Near:     Physical Exam Vitals and nursing note reviewed.  Constitutional:      General: She is not in acute distress.    Appearance: Normal appearance. She is not ill-appearing.  HENT:     Head: Normocephalic and atraumatic.  Eyes:     General: Lids are normal.        Right eye: No foreign body, discharge or hordeolum.     Conjunctiva/sclera:     Right eye: Right conjunctiva is injected. No chemosis, exudate or hemorrhage.    Pupils: Pupils are equal, round, and reactive to light.     Right eye: No  corneal abrasion or fluorescein uptake.     Comments: There is some mild injection to the inner aspect conjunctiva.  No visible foreign body.  Cardiovascular:     Rate and Rhythm: Normal rate.  Pulmonary:     Effort: Pulmonary effort is normal.  Skin:    General: Skin is warm and dry.  Neurological:     General: No focal deficit present.     Mental Status: She is alert and oriented to person, place, and time.  Psychiatric:        Mood and Affect: Mood normal.        Behavior: Behavior normal.      UC Treatments / Results  Labs (all labs ordered are listed, but only abnormal results are displayed) Labs Reviewed - No data to display  EKG   Radiology No results found.  Procedures Procedures (including critical care time)  Medications Ordered in UC Medications - No data to display  Initial Impression / Assessment and Plan / UC Course  I have reviewed the triage vital signs and the nursing notes.  Pertinent labs & imaging results that were available during my care of the patient were reviewed by me and considered in my medical decision making (see chart for details).     Reviewed exam and symptoms with patient.  Negative fluorescein uptake.  No visible foreign body on exam.  Patient with continued eye symptoms after possible foreign body.  Out of caution we will start antibiotic eyedrops and patient is to follow-up with her ophthalmologist next week for recheck and further evaluation.  She was instructed to go to the ER for any worsening symptoms that occur prior to her seeing ophthalmology and she verbalized understanding. Final Clinical Impressions(s) / UC Diagnoses   Final diagnoses:  Acute conjunctivitis of right eye, unspecified acute conjunctivitis type     Discharge Instructions      Start ofloxacin antibiotic eyedrops as prescribed.  Please follow-up with  your ophthalmologist next week for further recheck.  Please go to the ER if you develop any worsening  symptoms prior to seeing your ophthalmologist.  This includes but is not limited to eye pain, visual changes, or any new concerns that arise.  I hope you feel better soon!   ED Prescriptions     Medication Sig Dispense Auth. Provider   ofloxacin (OCUFLOX) 0.3 % ophthalmic solution Place 1 drop into the right eye 4 (four) times daily for 7 days. 5 mL Radford Pax, NP      PDMP not reviewed this encounter.   Radford Pax, NP 08/07/23 1012

## 2023-08-07 NOTE — ED Triage Notes (Signed)
Pt states about 2 weeks ago she was at work and got something in her right eye. States she rinsed it with water immediately after. States since then her eye is itchy and red.

## 2023-08-18 ENCOUNTER — Ambulatory Visit (INDEPENDENT_AMBULATORY_CARE_PROVIDER_SITE_OTHER): Payer: BC Managed Care – PPO | Admitting: Medical

## 2023-08-18 VITALS — BP 110/54 | HR 61 | Temp 98.0°F | Resp 16 | Ht 61.0 in | Wt 166.6 lb

## 2023-08-18 DIAGNOSIS — Z0000 Encounter for general adult medical examination without abnormal findings: Secondary | ICD-10-CM | POA: Diagnosis not present

## 2023-08-18 DIAGNOSIS — Z124 Encounter for screening for malignant neoplasm of cervix: Secondary | ICD-10-CM

## 2023-08-18 DIAGNOSIS — H5789 Other specified disorders of eye and adnexa: Secondary | ICD-10-CM | POA: Diagnosis not present

## 2023-08-18 LAB — LIPID PANEL
Cholesterol: 170 mg/dL (ref 0–200)
HDL: 47.8 mg/dL (ref >39.00)
LDL Cholesterol: 100 mg/dL — ABNORMAL HIGH (ref 0–99)
NonHDL: 122.15
Total CHOL/HDL Ratio: 4
Triglycerides: 112 mg/dL (ref 0.0–149.0)
VLDL: 22.4 mg/dL (ref 0.0–40.0)

## 2023-08-18 LAB — CBC WITH DIFFERENTIAL/PLATELET
Basophils Absolute: 0 10*3/uL (ref 0.0–0.1)
Basophils Relative: 0.5 % (ref 0.0–3.0)
Eosinophils Absolute: 0.1 10*3/uL (ref 0.0–0.7)
Eosinophils Relative: 0.6 % (ref 0.0–5.0)
HCT: 41.5 % (ref 36.0–46.0)
Hemoglobin: 13.5 g/dL (ref 12.0–15.0)
Lymphocytes Relative: 34 % (ref 12.0–46.0)
Lymphs Abs: 2.9 10*3/uL (ref 0.7–4.0)
MCHC: 32.5 g/dL (ref 30.0–36.0)
MCV: 88.8 fL (ref 78.0–100.0)
Monocytes Absolute: 0.5 10*3/uL (ref 0.1–1.0)
Monocytes Relative: 6.4 % (ref 3.0–12.0)
Neutro Abs: 5 10*3/uL (ref 1.4–7.7)
Neutrophils Relative %: 58.5 % (ref 43.0–77.0)
Platelets: 407 10*3/uL — ABNORMAL HIGH (ref 150.0–400.0)
RBC: 4.67 Mil/uL (ref 3.87–5.11)
RDW: 13.2 % (ref 11.5–15.5)
WBC: 8.6 10*3/uL (ref 4.0–10.5)

## 2023-08-18 LAB — COMPREHENSIVE METABOLIC PANEL
ALT: 16 U/L (ref 0–35)
AST: 13 U/L (ref 0–37)
Albumin: 4.4 g/dL (ref 3.5–5.2)
Alkaline Phosphatase: 76 U/L (ref 39–117)
BUN: 14 mg/dL (ref 6–23)
CO2: 26 meq/L (ref 19–32)
Calcium: 9.3 mg/dL (ref 8.4–10.5)
Chloride: 106 meq/L (ref 96–112)
Creatinine, Ser: 0.66 mg/dL (ref 0.40–1.20)
GFR: 107.33 mL/min (ref >60.00)
Glucose, Bld: 86 mg/dL (ref 70–99)
Potassium: 3.8 meq/L (ref 3.5–5.1)
Sodium: 140 meq/L (ref 135–145)
Total Bilirubin: 0.4 mg/dL (ref 0.2–1.2)
Total Protein: 7.2 g/dL (ref 6.0–8.3)

## 2023-08-18 NOTE — Progress Notes (Signed)
Subjective:    Patient ID: Jacqueline Gray, female    DOB: [birth year], 42 y.o.   MRN: [id number]  HPI Pt in for wellness exam.     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 Pt states eating healthy. No smoker. No alcohol.  Pt declines flu vaccine. Pt thinks she is up to date on tdap. States quite certain got when pregnant.  Pt states some times since last pap. Pt has gyn/ob.  Pt mentioned at end of the exam got something in her rt eye 2 weeks ago. She went to ED  "Reviewed exam and symptoms with patient. Negative fluorescein uptake. No visible foreign body on exam. Patient with continued eye symptoms after possible foreign body. Out of caution we will start antibiotic eyedrops and patient is to follow-up with her ophthalmologist next week for recheck and further evaluation. She was instructed to go to the ER for any worsening symptoms that occur prior to her seeing ophthalmology and she verbalized understanding."   Pt stll has eye redness but occasional eye. She did not see specialist. She has optometrist. No eye pain or vision changes. Occasional itch.   Review of Systems  Constitutional:  Negative for chills, fatigue and fever.  Respiratory:  Negative for chest tightness, shortness of breath and wheezing.   Cardiovascular:  Negative for chest pain and palpitations.  Gastrointestinal:  Negative for abdominal pain, anal bleeding, diarrhea and rectal pain.  Genitourinary:  Negative for dysuria, flank pain, frequency and pelvic pain.  Musculoskeletal:  Negative for back pain and joint swelling.  Skin:  Negative for rash.  Neurological:  Negative for dizziness, speech difficulty, weakness, numbness and headaches.  Hematological:  Negative for adenopathy. Does not bruise/bleed easily.  Psychiatric/Behavioral:  Negative for behavioral problems and decreased concentration.      Past Medical History:  Diagnosis Date   Headache    Migraines    SVD (spontaneous vaginal delivery) 05/13/2017   SVD (spontaneous vaginal delivery) 06/08/2019     Social History   Socioeconomic History   Marital status: Married    Spouse name: Not on file   Number of children: Not on file   Years of education: Not on file   Highest education level: Not on file  Occupational History   Not on file  Tobacco Use   Smoking status: Never   Smokeless tobacco: Never  Vaping Use   Vaping status: Never Used  Substance and Sexual Activity   Alcohol use: No   Drug use: No   Sexual activity: Yes  Other Topics Concern   Not on file  Social History Narrative   Not on file   Social Determinants of Health   Financial Resource Strain: Not on file  Food Insecurity: Not on file  Transportation Needs: Not on file  Physical Activity: Not on file  Stress: Not on file  Social Connections: Not on file  Intimate Partner Violence: Not on file    Past Surgical History:  Procedure Laterality Date   NERVE AND TENDON REPAIR Left    arm   tummy tuck  2004    Family History  Problem Relation Age of Onset   Cancer Mother     Allergies  Allergen Reactions   Other Shortness Of Breath    Spicy peppers    Current Outpatient Medications on File Prior to Visit  Medication Sig Dispense Refill   ibuprofen (ADVIL) 600 MG tablet Take 1 tablet (600 mg  total) by mouth every 6 (six) hours. 30 tablet 0   Prenatal Vit-Fe Fumarate-FA (PRENATAL MULTIVITAMIN) TABS tablet Take 1 tablet by mouth daily at 12 noon.     No current facility-administered medications on file prior to visit.    BP (!) 110/54 (BP Location: Left Arm, Patient Position: Sitting, Cuff Size: Normal)   Pulse 61   Temp 98 F (36.7 C) (Oral)   Resp 16   Ht 5\' 1"  (1.549 m)   Wt 166 lb 9.6 oz (75.6 kg)   LMP 07/06/2023 (Exact Date)   SpO2 97%   BMI 31.48 kg/m       Objective:   Physical Exam  General Mental Status- Alert. General Appearance- Not in acute distress.   Skin General:  Color- Normal Color. Moisture- Normal Moisture.  Neck Carotid Arteries- Normal color. Moisture- Normal Moisture. No carotid bruits. No JVD.  Chest and Lung Exam Auscultation: Breath Sounds:-Normal.  Cardiovascular Auscultation:Rythm- Regular. Murmurs & Other Heart Sounds:Auscultation of the heart reveals- No Murmurs.  Abdomen Inspection:-Inspeection Normal. Palpation/Percussion:Note:No mass. Palpation and Percussion of the abdomen reveal- Non Tender, Non Distended + BS, no rebound or guarding.   Neurologic Cranial Nerve exam:- CN III-XII intact(No nystagmus), symmetric smile. Strength:- 5/5 equal and symmetric strength both upper and lower extremities.   Yes- perrl/ medial conjunciva. Mild red. But no dc.    Assessment & Plan:   Patient Instructions  For you wellness exam today I have ordered cbc, cmp and lipid panel.  Flu vaccine declined. If you can get me tdap date you received in past.  Refer to gyn for papsmear  Recommend exercise and healthy diet.  We will let you know lab results as they come in.  Follow up date appointment will be determined after lab review.       Esperanza Richters, PA-C   For eye irritation do recommend you see your optometrist. They can evaluate our eye and see if you need to see eye MD. You may have early growth on conjunctiva  [id number] charge as did exam eye and gave advise. Discuss diff dx etc.

## 2023-08-18 NOTE — Patient Instructions (Addendum)
For you wellness exam today I have ordered cbc, cmp and lipid panel.  Flu vaccine declined. If you can get me tdap date you received in past.  Refer to gyn for papsmear  Recommend exercise and healthy diet.  We will let you know lab results as they come in.  Follow up date appointment will be determined after lab review.    For eye irritation do recommend you see your optometrist. They can evaluate our eye and see if you need to see eye MD. You may have early growth on conjunctiva  Preventive Care 27-70 Years Old, Female Preventive care refers to lifestyle choices and visits with your health care provider that can promote health and wellness. Preventive care visits are also called wellness exams. What can I expect for my preventive care visit? Counseling During your preventive care visit, your health care provider may ask about your: Medical history, including: Past medical problems. Family medical history. Pregnancy history. Current health, including: Menstrual cycle. Method of birth control. Emotional well-being. Home life and relationship well-being. Sexual activity and sexual health. Lifestyle, including: Alcohol, nicotine or tobacco, and drug use. Access to firearms. Diet, exercise, and sleep habits. Work and work Astronomer. Sunscreen use. Safety issues such as seatbelt and bike helmet use. Physical exam Your health care provider may check your: Height and weight. These may be used to calculate your BMI (body mass index). BMI is a measurement that tells if you are at a healthy weight. Waist circumference. This measures the distance around your waistline. This measurement also tells if you are at a healthy weight and may help predict your risk of certain diseases, such as type 2 diabetes and high blood pressure. Heart rate and blood pressure. Body temperature. Skin for abnormal spots. What immunizations do I need?  Vaccines are usually given at various ages,  according to a schedule. Your health care provider will recommend vaccines for you based on your age, medical history, and lifestyle or other factors, such as travel or where you work. What tests do I need? Screening Your health care provider may recommend screening tests for certain conditions. This may include: Pelvic exam and Pap test. Lipid and cholesterol levels. Diabetes screening. This is done by checking your blood sugar (glucose) after you have not eaten for a while (fasting). Hepatitis B test. Hepatitis C test. HIV (human immunodeficiency virus) test. STI (sexually transmitted infection) testing, if you are at risk. BRCA-related cancer screening. This may be done if you have a family history of breast, ovarian, tubal, or peritoneal cancers. Talk with your health care provider about your test results, treatment options, and if necessary, the need for more tests. Follow these instructions at home: Eating and drinking  Eat a healthy diet that includes fresh fruits and vegetables, whole grains, lean protein, and low-fat dairy products. Take vitamin and mineral supplements as recommended by your health care provider. Do not drink alcohol if: Your health care provider tells you not to drink. You are pregnant, may be pregnant, or are planning to become pregnant. If you drink alcohol: Limit how much you have to 0-1 drink a day. Know how much alcohol is in your drink. In the [place] one drink equals one 12 oz bottle of beer (355 mL), one 5 oz glass of wine (148 mL), or one 1 oz glass of hard liquor (44 mL). Lifestyle Brush your teeth every morning and night with fluoride toothpaste. Floss one time each day. Exercise for at least 30 minutes 5 or  more days each week. Do not use any products that contain nicotine or tobacco. These products include cigarettes, chewing tobacco, and vaping devices, such as e-cigarettes. If you need help quitting, ask your health care provider. Do not use  drugs. If you are sexually active, practice safe sex. Use a condom or other form of protection to prevent STIs. If you do not wish to become pregnant, use a form of birth control. If you plan to become pregnant, see your health care provider for a prepregnancy visit. Find healthy ways to manage stress, such as: Meditation, yoga, or listening to music. Journaling. Talking to a trusted person. Spending time with friends and family. Minimize exposure to UV radiation to reduce your risk of skin cancer. Safety Always wear your seat belt while driving or riding in a vehicle. Do not drive: If you have been drinking alcohol. Do not ride with someone who has been drinking. If you have been using any mind-altering substances or drugs. While texting. When you are tired or distracted. Wear a helmet and other protective equipment during sports activities. If you have firearms in your house, make sure you follow all gun safety procedures. Seek help if you have been physically or sexually abused. What's next? Go to your health care provider once a year for an annual wellness visit. Ask your health care provider how often you should have your eyes and teeth checked. Stay up to date on all vaccines. This information is not intended to replace advice given to you by your health care provider. Make sure you discuss any questions you have with your health care provider. Document Revised: 04/30/2021 Document Reviewed: 04/30/2021 Elsevier Patient Education  2024 [employer].

## 2023-09-25 ENCOUNTER — Emergency Department (HOSPITAL_BASED_OUTPATIENT_CLINIC_OR_DEPARTMENT_OTHER)
Admission: EM | Admit: 2023-09-25 | Discharge: 2023-09-25 | Disposition: A | Discharge status: Home / Self Care | Payer: BC Managed Care – PPO | Attending: Emergency Medicine | Admitting: Emergency Medicine

## 2023-09-25 ENCOUNTER — Encounter (HOSPITAL_BASED_OUTPATIENT_CLINIC_OR_DEPARTMENT_OTHER): Payer: Self-pay | Admitting: Emergency Medicine

## 2023-09-25 ENCOUNTER — Other Ambulatory Visit: Payer: Self-pay

## 2023-09-25 DIAGNOSIS — E860 Dehydration: Secondary | ICD-10-CM | POA: Diagnosis not present

## 2023-09-25 DIAGNOSIS — R42 Dizziness and giddiness: Secondary | ICD-10-CM | POA: Diagnosis present

## 2023-09-25 DIAGNOSIS — Z20822 Contact with and (suspected) exposure to covid-19: Secondary | ICD-10-CM | POA: Insufficient documentation

## 2023-09-25 LAB — URINALYSIS, ROUTINE W REFLEX MICROSCOPIC
Bilirubin Urine: NEGATIVE
Glucose, UA: NEGATIVE mg/dL
Ketones, ur: NEGATIVE mg/dL
Leukocytes,Ua: NEGATIVE
Nitrite: NEGATIVE
Protein, ur: NEGATIVE mg/dL
Specific Gravity, Urine: 1.025 (ref 1.005–1.030)
pH: 7 (ref 5.0–8.0)

## 2023-09-25 LAB — RESP PANEL BY RT-PCR (RSV, FLU A&B, COVID)  RVPGX2
Influenza A by PCR: NEGATIVE
Influenza B by PCR: NEGATIVE
Resp Syncytial Virus by PCR: NEGATIVE
SARS Coronavirus 2 by RT PCR: NEGATIVE

## 2023-09-25 LAB — CBC WITH DIFFERENTIAL/PLATELET
Abs Immature Granulocytes: 0.01 10*3/uL (ref 0.00–0.07)
Basophils Absolute: 0 10*3/uL (ref 0.0–0.1)
Basophils Relative: 0 %
Eosinophils Absolute: 0.2 10*3/uL (ref 0.0–0.5)
Eosinophils Relative: 6 %
HCT: 38.6 % (ref 36.0–46.0)
Hemoglobin: 12.7 g/dL (ref 12.0–15.0)
Immature Granulocytes: 0 %
Lymphocytes Relative: 21 %
Lymphs Abs: 0.6 10*3/uL — ABNORMAL LOW (ref 0.7–4.0)
MCH: 28.9 pg (ref 26.0–34.0)
MCHC: 32.9 g/dL (ref 30.0–36.0)
MCV: 87.7 fL (ref 80.0–100.0)
Monocytes Absolute: 0.3 10*3/uL (ref 0.1–1.0)
Monocytes Relative: 10 %
Neutro Abs: 1.8 10*3/uL (ref 1.7–7.7)
Neutrophils Relative %: 63 %
Platelets: 217 10*3/uL (ref 150–400)
RBC: 4.4 MIL/uL (ref 3.87–5.11)
RDW: 12.5 % (ref 11.5–15.5)
WBC: 2.8 10*3/uL — ABNORMAL LOW (ref 4.0–10.5)
nRBC: 0 % (ref 0.0–0.2)

## 2023-09-25 LAB — COMPREHENSIVE METABOLIC PANEL
ALT: 23 U/L (ref 0–44)
AST: 19 U/L (ref 15–41)
Albumin: 3.7 g/dL (ref 3.5–5.0)
Alkaline Phosphatase: 56 U/L (ref 38–126)
Anion gap: 8 (ref 5–15)
BUN: 14 mg/dL (ref 6–20)
CO2: 21 mmol/L — ABNORMAL LOW (ref 22–32)
Calcium: 8 mg/dL — ABNORMAL LOW (ref 8.9–10.3)
Chloride: 107 mmol/L (ref 98–111)
Creatinine, Ser: 0.84 mg/dL (ref 0.44–1.00)
GFR, Estimated: >60 mL/min (ref >60)
Glucose, Bld: 105 mg/dL — ABNORMAL HIGH (ref 70–99)
Potassium: 3.3 mmol/L — ABNORMAL LOW (ref 3.5–5.1)
Sodium: 136 mmol/L (ref 135–145)
Total Bilirubin: 0.3 mg/dL (ref <1.2)
Total Protein: 6.5 g/dL (ref 6.5–8.1)

## 2023-09-25 LAB — URINALYSIS, MICROSCOPIC (REFLEX)

## 2023-09-25 LAB — PREGNANCY, URINE: Preg Test, Ur: NEGATIVE

## 2023-09-25 NOTE — ED Provider Notes (Signed)
[place] [facility] AT [facility] Provider Note   CSN: [id number] Arrival date & time: 09/25/23  1922     History Chief Complaint  Patient presents with   Dizziness    HPI Jacqueline Gray is a 42 y.o. female presenting for multiple symptoms.  Endorses fever cough congestion fatigue lightheadedness all over the past 72 hours.  It has been intermittent in nature currently not present after taking NyQuil about 4 hours ago. Otherwise ambulatory tolerating p.o. intake.  States that she works weekends and had to come in to be evaluated to be cleared to not go to work..   Patient's recorded medical, surgical, social, medication list and allergies were reviewed in the Snapshot window as part of the initial history.   Review of Systems   Review of Systems  Constitutional:  Positive for fever. Negative for chills.  HENT:  Positive for congestion and sore throat. Negative for ear pain.   Eyes:  Negative for pain and visual disturbance.  Respiratory:  Positive for cough. Negative for shortness of breath.   Cardiovascular:  Negative for chest pain and palpitations.  Gastrointestinal:  Negative for abdominal pain and vomiting.  Genitourinary:  Negative for dysuria and hematuria.  Musculoskeletal:  Negative for arthralgias and back pain.  Skin:  Negative for color change and rash.  Neurological:  Negative for seizures and syncope.  All other systems reviewed and are negative.   Physical Exam Updated Vital Signs BP 126/64 (BP Location: Right Arm)   Pulse 99   Temp 99 F (37.2 C) (Oral)   Resp 16   Ht 5\' 4"  (1.626 m)   Wt 72.6 kg   LMP 09/06/2023   SpO2 98%   Breastfeeding Yes   BMI 27.46 kg/m  Physical Exam Vitals and nursing note reviewed.  Constitutional:      General: She is not in acute distress.    Appearance: She is well-developed.  HENT:     Head: Normocephalic and atraumatic.  Eyes:     Conjunctiva/sclera: Conjunctivae normal.   Cardiovascular:     Rate and Rhythm: Normal rate and regular rhythm.     Heart sounds: No murmur heard. Pulmonary:     Effort: Pulmonary effort is normal. No respiratory distress.     Breath sounds: Normal breath sounds.  Abdominal:     General: There is no distension.     Palpations: Abdomen is soft.     Tenderness: There is no abdominal tenderness. There is no right CVA tenderness or left CVA tenderness.  Musculoskeletal:        General: No swelling or tenderness. Normal range of motion.     Cervical back: Neck supple.  Skin:    General: Skin is warm and dry.  Neurological:     General: No focal deficit present.     Mental Status: She is alert and oriented to person, place, and time. Mental status is at baseline.     Cranial Nerves: No cranial nerve deficit.      ED Course/ Medical Decision Making/ A&P    Procedures Procedures   Medications Ordered in ED Medications - No data to display Medical Decision Making:   Bissie Dingee is a 42 y.o. female who presented to the ED today with subjective fever, cough, congestion detailed above.    Patient placed on continuous vitals and telemetry monitoring while in ED which was reviewed periodically.   Complete initial physical exam performed, notably the patient  was hemodynamically stable in no acute distress.  Posterior oropharynx illuminated and without obvious swelling or deformity.  Patient is without neck stiffness.    Reviewed and confirmed nursing documentation for past medical history, family history, social history.    Initial Assessment:   With the patient's presentation of fever cough congestion, most likely diagnosis is developing viral upper respiratory infection. Other diagnoses were considered including (but not limited to) peritonsillar abscess, retropharyngeal abscess, pneumonia. These are considered less likely due to history of present illness and physical exam findings.   This is most consistent with an  acute complicated illness Considered meningitis, however patient's symptoms, vital signs, physical exam findings including lack of meningismus seem grossly less consistent at this time. Initial Plan:  Screening labs including CBC and Metabolic panel to evaluate for infectious or metabolic etiology of disease.  Viral screening including COVID/flu testing to evaluate for common viral etiologies that need to be tracked Empiric treatment with antipyretics including acetaminophen in ambulatory setting Objective evaluation as below reviewed   Initial Study Results:   Laboratory  All laboratory results reviewed without evidence of clinically relevant pathology.       Final Assessment and Plan:   On reassessment, patient is ambulatory tolerating p.o. intake in no acute distress.   Patient's COVID test is negative.  Patient is currently stable for outpatient care and management with no indication for hospitalization or transfer at this time.  Discussed all findings with patient expressed understanding.  Disposition:  Based on the above findings, I believe patient is stable for discharge.    Patient/family educated about specific return precautions for given chief complaint and symptoms.  Patient/family educated about follow-up with PCP.     Patient/family expressed understanding of return precautions and need for follow-up. Patient spoken to regarding all imaging and laboratory results and appropriate follow up for these results. All education provided in verbal form with additional information in written form. Time was allowed for answering of patient questions. Patient discharged.    Emergency Department Medication Summary:   Medications - No data to display      Clinical Impression:  1. Dehydration      Discharge   Final Clinical Impression(s) / ED Diagnoses Final diagnoses:  Dehydration    Rx / DC Orders ED Discharge Orders     None         Glyn Ade,  MD 09/25/23 2237

## 2023-09-25 NOTE — ED Triage Notes (Signed)
Pt c/o fever, body aches, dizziness and upper back pain x 3d; had Nyquil at 1630

## 2023-09-26 ENCOUNTER — Ambulatory Visit: Payer: Self-pay

## 2024-08-03 ENCOUNTER — Telehealth: Payer: Self-pay | Admitting: *Deleted

## 2024-08-03 NOTE — Telephone Encounter (Signed)
 Returned call from 08/02/2024 at 1:06 PM. Language line used. Voicemail is full, not able to leave a message.

## 2024-08-09 ENCOUNTER — Other Ambulatory Visit: Payer: Self-pay | Admitting: Medical

## 2024-08-09 DIAGNOSIS — Z1231 Encounter for screening mammogram for malignant neoplasm of breast: Secondary | ICD-10-CM

## 2024-08-21 ENCOUNTER — Encounter: Admitting: Medical

## 2024-08-23 ENCOUNTER — Encounter

## 2024-08-23 DIAGNOSIS — Z1231 Encounter for screening mammogram for malignant neoplasm of breast: Secondary | ICD-10-CM

## 2024-08-29 ENCOUNTER — Ambulatory Visit: Payer: Self-pay | Admitting: Medical

## 2024-08-29 ENCOUNTER — Ambulatory Visit (INDEPENDENT_AMBULATORY_CARE_PROVIDER_SITE_OTHER): Admitting: Medical

## 2024-08-29 VITALS — BP 110/82 | HR 68 | Temp 98.0°F | Resp 16 | Ht 64.0 in | Wt 179.8 lb

## 2024-08-29 DIAGNOSIS — Z0000 Encounter for general adult medical examination without abnormal findings: Secondary | ICD-10-CM

## 2024-08-29 DIAGNOSIS — Z1159 Encounter for screening for other viral diseases: Secondary | ICD-10-CM

## 2024-08-29 LAB — CBC WITH DIFFERENTIAL/PLATELET
Basophils Absolute: 0 K/uL (ref 0.0–0.1)
Basophils Relative: 0.4 % (ref 0.0–3.0)
Eosinophils Absolute: 0.1 K/uL (ref 0.0–0.7)
Eosinophils Relative: 1.4 % (ref 0.0–5.0)
HCT: 41.8 % (ref 36.0–46.0)
Hemoglobin: 13.8 g/dL (ref 12.0–15.0)
Lymphocytes Relative: 34.1 % (ref 12.0–46.0)
Lymphs Abs: 2.4 K/uL (ref 0.7–4.0)
MCHC: 33.1 g/dL (ref 30.0–36.0)
MCV: 86.6 fl (ref 78.0–100.0)
Monocytes Absolute: 0.5 K/uL (ref 0.1–1.0)
Monocytes Relative: 6.8 % (ref 3.0–12.0)
Neutro Abs: 4.1 K/uL (ref 1.4–7.7)
Neutrophils Relative %: 57.3 % (ref 43.0–77.0)
Platelets: 334 K/uL (ref 150.0–400.0)
RBC: 4.83 Mil/uL (ref 3.87–5.11)
RDW: 13.1 % (ref 11.5–15.5)
WBC: 7.2 K/uL (ref 4.0–10.5)

## 2024-08-29 LAB — COMPREHENSIVE METABOLIC PANEL WITH GFR
ALT: 37 U/L — ABNORMAL HIGH (ref 0–35)
AST: 21 U/L (ref 0–37)
Albumin: 4.4 g/dL (ref 3.5–5.2)
Alkaline Phosphatase: 75 U/L (ref 39–117)
BUN: 13 mg/dL (ref 6–23)
CO2: 28 meq/L (ref 19–32)
Calcium: 9.2 mg/dL (ref 8.4–10.5)
Chloride: 104 meq/L (ref 96–112)
Creatinine, Ser: 0.65 mg/dL (ref 0.40–1.20)
GFR: 106.94 mL/min (ref >60.00)
Glucose, Bld: 91 mg/dL (ref 70–99)
Potassium: 4.2 meq/L (ref 3.5–5.1)
Sodium: 140 meq/L (ref 135–145)
Total Bilirubin: 0.4 mg/dL (ref 0.2–1.2)
Total Protein: 7.1 g/dL (ref 6.0–8.3)

## 2024-08-29 LAB — LIPID PANEL
Cholesterol: 155 mg/dL (ref 0–200)
HDL: 43.5 mg/dL (ref >39.00)
LDL Cholesterol: 84 mg/dL (ref 0–99)
NonHDL: 111.95
Total CHOL/HDL Ratio: 4
Triglycerides: 139 mg/dL (ref 0.0–149.0)
VLDL: 27.8 mg/dL (ref 0.0–40.0)

## 2024-08-29 NOTE — Addendum Note (Signed)
 Addended by: DORINA DALLAS HERO on: 08/29/2024 09:24 AM   Modules accepted: Orders

## 2024-08-29 NOTE — Progress Notes (Signed)
 Subjective:    Patient ID: Jacqueline Gray, female    DOB: 06/01/1981, 43 y.o.   MRN: [id number]  HPI  Pt in for wellness exam.     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 Pt states eating healthy. No smoker. No alcohol.No sodas except for rare occasion.   Pt states last year did not get pap. Had problems getting scheduled. She is now scheduled for November.  Pt states that her insurance won't pay for mammogram or location states issue with insurance.? Pt was given a code after discussing with insurance. Pt never had mammogram done before.      Review of Systems  Constitutional:  Negative for chills, fatigue and fever.  HENT:  Negative for congestion, drooling and ear pain.   Respiratory:  Negative for cough, chest tightness, shortness of breath and wheezing.   Cardiovascular:  Negative for chest pain and palpitations.  Gastrointestinal:  Negative for abdominal pain, blood in stool, diarrhea, nausea and vomiting.  Genitourinary:  Negative for difficulty urinating, dysuria and frequency.  Musculoskeletal:  Negative for back pain and joint swelling.  Neurological:  Negative for dizziness, syncope, weakness, numbness and headaches.  Hematological:  Negative for adenopathy.  Psychiatric/Behavioral:  Negative for behavioral problems, decreased concentration and hallucinations. The patient is not nervous/anxious.       Past Medical History:  Diagnosis Date   Headache    Migraines   SVD (spontaneous vaginal delivery) 05/13/2017   SVD (spontaneous vaginal delivery) 06/08/2019     Social History   Socioeconomic History   Marital status: Married    Spouse name: Not on file   Number of children: Not on file   Years of education: Not on file   Highest education level: Master's degree (e.g., MA, MS, MEng, MEd, MSW, MBA)  Occupational History   Not on file  Tobacco Use   Smoking status: Never   Smokeless tobacco: Never  Vaping Use    Vaping status: Never Used  Substance and Sexual Activity   Alcohol use: No   Drug use: No   Sexual activity: Yes  Other Topics Concern   Not on file  Social History Narrative   Not on file   Social Drivers of Health   Financial Resource Strain: Low Risk  (08/22/2024)   Overall Financial Resource Strain (CARDIA)    Difficulty of Paying Living Expenses: Not very hard  Food Insecurity: No Food Insecurity (08/22/2024)   Hunger Vital Sign    Worried About Running Out of Food in the Last Year: Never true    Ran Out of Food in the Last Year: Never true  Transportation Needs: No Transportation Needs (08/22/2024)   PRAPARE - Administrator, Civil Service (Medical): No    Lack of Transportation (Non-Medical): No  Physical Activity: Inactive (08/22/2024)   Exercise Vital Sign    Days of Exercise per Week: 0 days    Minutes of Exercise per Session: Not on file  Stress: No Stress Concern Present (08/22/2024)   [employer] of Occupational Health - Occupational Stress Questionnaire    Feeling of Stress: Not at all  Social Connections: Moderately Integrated (08/22/2024)   Social Connection and Isolation Panel    Frequency of Communication with Friends and Family: More than three times a week    Frequency of Social Gatherings with Friends and Family: Never    Attends Religious Services: More than 4 times per year  Active Member of Clubs or Organizations: No    Attends Engineer, structural: Not on file    Marital Status: Married  Catering manager Violence: Not on file    Past Surgical History:  Procedure Laterality Date   NERVE AND TENDON REPAIR Left    arm   tummy tuck  2004    Family History  Problem Relation Age of Onset   Cancer Mother     Allergies  Allergen Reactions   Other Shortness Of Breath    Spicy peppers    No current outpatient medications on file prior to visit.   No current facility-administered medications on file prior to visit.     BP 110/82   Pulse 68   Temp 98 F (36.7 C) (Oral)   Resp 16   Ht 5' 4 (1.626 m)   Wt 179 lb 12.8 oz (81.6 kg)   LMP 08/02/2024 (Exact Date)   SpO2 95%   Breastfeeding No   BMI 30.86 kg/m        Objective:   Physical Exam  General Mental Status- Alert. General Appearance- Not in acute distress.   Skin General: Color- Normal Color. Moisture- Normal Moisture.  Neck No JVD.  Chest and Lung Exam Auscultation: Breath Sounds:CTA  Cardiovascular Auscultation:Rythm- RRR Murmurs & Other Heart Sounds:Auscultation of the heart reveals- No Murmurs.  Abdomen Inspection:-Inspeection Normal. Palpation/Percussion:Note:No mass. Palpation and Percussion of the abdomen reveal- Non Tender, Non Distended + BS, no rebound or guarding.   Neurologic Cranial Nerve exam:- CN III-XII intact(No nystagmus), symmetric smile. Strength:- 5/5 equal and symmetric strength both upper and lower extremities.       Assessment & Plan:   Patient Instructions  For you wellness exam today I have ordered cbc, cmp, hep c antibody and lipid panel.  Pt declines flu vaccine.  Recommend exercise and healthy diet.  We will let you know lab results as they come in.  Follow up date appointment will be determined after lab review.    November gyn appt for pap smear. Ask them to order mammogram and explain about coding issues/other clinic stating not covered. Let me know what they say/send my chart     Dallas Maxwell, PA-C

## 2024-08-29 NOTE — Patient Instructions (Addendum)
 For you wellness exam today I have ordered cbc, cmp, hep c antibody and lipid panel.  Pt declines flu vaccine.  Recommend exercise and healthy diet.  We will let you know lab results as they come in.  Follow up date appointment will be determined after lab review.    November gyn appt for pap smear. Ask them to order mammogram and explain about coding issues/other clinic stating not covered. Let me know what they say/send my chart    Preventive Care 61-34 Years Old, Female Preventive care refers to lifestyle choices and visits with your health care provider that can promote health and wellness. Preventive care visits are also called wellness exams. What can I expect for my preventive care visit? Counseling Your health care provider may ask you questions about your: Medical history, including: Past medical problems. Family medical history. Pregnancy history. Current health, including: Menstrual cycle. Method of birth control. Emotional well-being. Home life and relationship well-being. Sexual activity and sexual health. Lifestyle, including: Alcohol, nicotine or tobacco, and drug use. Access to firearms. Diet, exercise, and sleep habits. Work and work Astronomer. Sunscreen use. Safety issues such as seatbelt and bike helmet use. Physical exam Your health care provider will check your: Height and weight. These may be used to calculate your BMI (body mass index). BMI is a measurement that tells if you are at a healthy weight. Waist circumference. This measures the distance around your waistline. This measurement also tells if you are at a healthy weight and may help predict your risk of certain diseases, such as type 2 diabetes and high blood pressure. Heart rate and blood pressure. Body temperature. Skin for abnormal spots. What immunizations do I need?  Vaccines are usually given at various ages, according to a schedule. Your health care provider will recommend vaccines  for you based on your age, medical history, and lifestyle or other factors, such as travel or where you work. What tests do I need? Screening Your health care provider may recommend screening tests for certain conditions. This may include: Lipid and cholesterol levels. Diabetes screening. This is done by checking your blood sugar (glucose) after you have not eaten for a while (fasting). Pelvic exam and Pap test. Hepatitis B test. Hepatitis C test. HIV (human immunodeficiency virus) test. STI (sexually transmitted infection) testing, if you are at risk. Lung cancer screening. Colorectal cancer screening. Mammogram. Talk with your health care provider about when you should start having regular mammograms. This may depend on whether you have a family history of breast cancer. BRCA-related cancer screening. This may be done if you have a family history of breast, ovarian, tubal, or peritoneal cancers. Bone density scan. This is done to screen for osteoporosis. Talk with your health care provider about your test results, treatment options, and if necessary, the need for more tests. Follow these instructions at home: Eating and drinking  Eat a diet that includes fresh fruits and vegetables, whole grains, lean protein, and low-fat dairy products. Take vitamin and mineral supplements as recommended by your health care provider. Do not drink alcohol if: Your health care provider tells you not to drink. You are pregnant, may be pregnant, or are planning to become pregnant. If you drink alcohol: Limit how much you have to 0-1 drink a day. Know how much alcohol is in your drink. In the [place] one drink equals one 12 oz bottle of beer (355 mL), one 5 oz glass of wine (148 mL), or one 1 oz glass of hard  liquor (44 mL). Lifestyle Brush your teeth every morning and night with fluoride toothpaste. Floss one time each day. Exercise for at least 30 minutes 5 or more days each week. Do not use any  products that contain nicotine or tobacco. These products include cigarettes, chewing tobacco, and vaping devices, such as e-cigarettes. If you need help quitting, ask your health care provider. Do not use drugs. If you are sexually active, practice safe sex. Use a condom or other form of protection to prevent STIs. If you do not wish to become pregnant, use a form of birth control. If you plan to become pregnant, see your health care provider for a prepregnancy visit. Take aspirin only as told by your health care provider. Make sure that you understand how much to take and what form to take. Work with your health care provider to find out whether it is safe and beneficial for you to take aspirin daily. Find healthy ways to manage stress, such as: Meditation, yoga, or listening to music. Journaling. Talking to a trusted person. Spending time with friends and family. Minimize exposure to UV radiation to reduce your risk of skin cancer. Safety Always wear your seat belt while driving or riding in a vehicle. Do not drive: If you have been drinking alcohol. Do not ride with someone who has been drinking. When you are tired or distracted. While texting. If you have been using any mind-altering substances or drugs. Wear a helmet and other protective equipment during sports activities. If you have firearms in your house, make sure you follow all gun safety procedures. Seek help if you have been physically or sexually abused. What's next? Visit your health care provider once a year for an annual wellness visit. Ask your health care provider how often you should have your eyes and teeth checked. Stay up to date on all vaccines. This information is not intended to replace advice given to you by your health care provider. Make sure you discuss any questions you have with your health care provider. Document Revised: 04/30/2021 Document Reviewed: 04/30/2021 Elsevier Patient Education  2024 [employer].

## 2024-08-30 LAB — HEPATITIS C ANTIBODY: Hepatitis C Ab: NONREACTIVE

## 2024-09-23 ENCOUNTER — Encounter: Payer: Self-pay | Admitting: Obstetrics and Gynecology

## 2024-09-23 NOTE — Progress Notes (Unsigned)
   ANNUAL EXAM Patient name: Jacqueline Gray MRN [id number]  Date of birth: [birth year] Chief Complaint:   No chief complaint on file.  History of Present Illness:   Jacqueline Gray is a 43 y.o. [phone] female being seen today for a routine annual exam.   Current concerns: ***  Current birth control: ***  Patient's last menstrual period was 08/02/2024 (exact date).   Last MXR: None on file.   Gardasil: ***  Last Pap/Pap History:  H/O abnormal pap: {yes/yes***/no:23866} No recent pap on file.   Review of Systems:   Pertinent items are noted in HPI Denies any headaches, blurred vision, fatigue, shortness of breath, chest pain, abdominal pain, abnormal vaginal discharge/itching/odor/irritation, problems with periods, bowel movements, urination, or intercourse unless otherwise stated above. *** Pertinent History Reviewed:  Reviewed past medical,surgical, social and family history.  Reviewed problem list, medications and allergies. Physical Assessment:  There were no vitals filed for this visit.There is no height or weight on file to calculate BMI.   Physical Examination:  General appearance - well appearing, and in no distress Mental status - alert, oriented to person, place, and time Psych:  She has a normal mood and affect Skin - warm and dry, normal color, no suspicious lesions noted Chest - effort normal Heart - normal rate  Breasts - breasts appear normal, no suspicious masses, no skin or nipple changes or axillary nodes Abdomen - soft, nontender, nondistended, no masses or organomegaly Pelvic -  {Blank single:19197::Performed and:,declines,not indicated} VULVA: {Blank single:19197::Not examined,normal appearing vulva with no masses, tenderness or lesions,***} VAGINA: {Blank single:19197::Not examined,normal appearing vagina with normal color and discharge, no lesions,***} CERVIX: {Blank single:19197::Not examined,normal appearing cervix  without discharge or lesions, no CMT.,***} UTERUS: {Blank single:19197::Not examined,uterus is felt to be normal size, shape, consistency and nontender,***} ADNEXA: {Blank single:19197::Not examined,No adnexal masses or tenderness noted,***} Extremities:  No swelling or varicosities noted  Chaperone present for exam  No results found for this or any previous visit (from the past 24 hours).  Assessment & Plan:  Diagnoses and all orders for this visit:  Encounter for annual routine gynecological examination  - Cervical cancer screening: Discussed guidelines. Pap with HPV done - Gardasil: {Blank single:19197::***,has not yet had. Will provide information,completed,has not yet had. Counseling provided and she declines,Has not yet had. Counseling provided and pt accepts} - STD Testing: {Blank single:19197::accepts,declines,not indicated} - Birth Control: Discussed options and their risks, benefits and common side effects; discussed VTE with estrogen containing options. Desires: {Birth control type:23956} - Breast Health: Encouraged self breast awareness/SBE. Teaching provided. Discussed limits of clinical breast exam for detecting breast cancer. Has MXR rx from pcp.  - F/U 12 months and prn     No orders of the defined types were placed in this encounter.   Meds: No orders of the defined types were placed in this encounter.   Follow-up: No follow-ups on file.  Vina Solian, MD 09/23/2024 7:11 PM

## 2024-09-27 ENCOUNTER — Other Ambulatory Visit (HOSPITAL_COMMUNITY)
Admission: RE | Admit: 2024-09-27 | Discharge: 2024-09-27 | Disposition: A | Discharge status: Home / Self Care | Source: Ambulatory Visit | Attending: Obstetrics and Gynecology | Admitting: Obstetrics and Gynecology

## 2024-09-27 ENCOUNTER — Encounter: Payer: Self-pay | Admitting: Obstetrics and Gynecology

## 2024-09-27 ENCOUNTER — Ambulatory Visit (INDEPENDENT_AMBULATORY_CARE_PROVIDER_SITE_OTHER): Admitting: Obstetrics and Gynecology

## 2024-09-27 VITALS — BP 127/81 | HR 77 | Ht 64.0 in | Wt 184.0 lb

## 2024-09-27 DIAGNOSIS — N951 Menopausal and female climacteric states: Secondary | ICD-10-CM | POA: Diagnosis not present

## 2024-09-27 DIAGNOSIS — Z01419 Encounter for gynecological examination (general) (routine) without abnormal findings: Secondary | ICD-10-CM | POA: Diagnosis not present

## 2024-09-27 DIAGNOSIS — Z803 Family history of malignant neoplasm of breast: Secondary | ICD-10-CM | POA: Insufficient documentation

## 2024-09-27 DIAGNOSIS — N841 Polyp of cervix uteri: Secondary | ICD-10-CM

## 2024-09-28 ENCOUNTER — Ambulatory Visit: Payer: Self-pay | Admitting: Obstetrics and Gynecology

## 2024-09-28 ENCOUNTER — Ambulatory Visit

## 2024-09-28 DIAGNOSIS — Z803 Family history of malignant neoplasm of breast: Secondary | ICD-10-CM

## 2024-09-28 DIAGNOSIS — Z01419 Encounter for gynecological examination (general) (routine) without abnormal findings: Secondary | ICD-10-CM

## 2024-09-28 DIAGNOSIS — Z1231 Encounter for screening mammogram for malignant neoplasm of breast: Secondary | ICD-10-CM | POA: Diagnosis not present

## 2024-09-28 LAB — SURGICAL PATHOLOGY

## 2024-09-29 LAB — CYTOLOGY - PAP
Comment: NEGATIVE
Diagnosis: NEGATIVE
High risk HPV: NEGATIVE
